# Patient Record
Sex: Female | Born: 1960 | Marital: Married
Health system: Southern US, Community
[De-identification: ages and names within clinical notes are randomized; demographics above are authoritative.]

## PROBLEM LIST (undated history)

## (undated) DIAGNOSIS — R011 Cardiac murmur, unspecified: Secondary | ICD-10-CM

## (undated) DIAGNOSIS — R61 Generalized hyperhidrosis: Secondary | ICD-10-CM

## (undated) DIAGNOSIS — K644 Residual hemorrhoidal skin tags: Secondary | ICD-10-CM

## (undated) DIAGNOSIS — B019 Varicella without complication: Secondary | ICD-10-CM

## (undated) DIAGNOSIS — I10 Essential (primary) hypertension: Secondary | ICD-10-CM

## (undated) DIAGNOSIS — E559 Vitamin D deficiency, unspecified: Secondary | ICD-10-CM

## (undated) DIAGNOSIS — N951 Menopausal and female climacteric states: Secondary | ICD-10-CM

## (undated) DIAGNOSIS — R5383 Other fatigue: Secondary | ICD-10-CM

## (undated) DIAGNOSIS — D2370 Other benign neoplasm of skin of unspecified lower limb, including hip: Secondary | ICD-10-CM

## (undated) DIAGNOSIS — E785 Hyperlipidemia, unspecified: Secondary | ICD-10-CM

## (undated) DIAGNOSIS — Z1211 Encounter for screening for malignant neoplasm of colon: Secondary | ICD-10-CM

## (undated) DIAGNOSIS — I6529 Occlusion and stenosis of unspecified carotid artery: Secondary | ICD-10-CM

## (undated) HISTORY — DX: Vitamin D deficiency, unspecified: E55.9

## (undated) HISTORY — DX: Varicella without complication: B01.9

## (undated) HISTORY — DX: Generalized hyperhidrosis: R61

## (undated) HISTORY — DX: Encounter for screening for malignant neoplasm of colon: Z12.11

## (undated) HISTORY — DX: Residual hemorrhoidal skin tags: K64.4

## (undated) HISTORY — DX: Occlusion and stenosis of unspecified carotid artery: I65.29

## (undated) HISTORY — DX: Cardiac murmur, unspecified: R01.1

## (undated) HISTORY — DX: Other benign neoplasm of skin of unspecified lower limb, including hip: D23.70

## (undated) HISTORY — DX: Hyperlipidemia, unspecified: E78.5

## (undated) HISTORY — DX: Menopausal and female climacteric states: N95.1

## (undated) HISTORY — DX: Essential (primary) hypertension: I10

## (undated) HISTORY — DX: Other fatigue: R53.83

---
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
## 2005-03-17 HISTORY — PX: GANGLION CYST EXCISION: SHX1691

## 2006-03-17 DIAGNOSIS — E7800 Pure hypercholesterolemia, unspecified: Secondary | ICD-10-CM | POA: Insufficient documentation

## 2006-03-17 DIAGNOSIS — R011 Cardiac murmur, unspecified: Secondary | ICD-10-CM

## 2006-03-17 HISTORY — DX: Cardiac murmur, unspecified: R01.1

## 2006-06-12 ENCOUNTER — Ambulatory Visit
Admit: 2006-06-12 | Disposition: A | Discharge status: Home / Self Care | Payer: Self-pay | Source: Ambulatory Visit | Admitting: Internal Medicine

## 2006-06-12 LAB — LIVER FUNCTION PANEL - AH CERNER
ALT: 18 U/L (ref 0–31)
AST (SGOT): 17 U/L (ref 0–31)
Albumin/Globulin Ratio: 1.8 (ref 1.1–2.2)
Albumin: 4.4 g/dL (ref 3.4–4.8)
Alkaline Phosphatase: 54 U/L (ref 35–104)
Bilirubin Direct: 0.2 mg/dL (ref 0.0–0.3)
Bilirubin Indirect: 0.6 mg/dL (ref 0.0–0.7)
Bilirubin, Total: 0.8 mg/dL (ref 0.0–1.0)
Globulin: 2.5 g/dL (ref 2.0–3.6)
Protein, Total: 6.9 g/dL (ref 6.4–8.3)

## 2006-06-12 LAB — CBC- CERNER
Hematocrit: 34.7 % — ABNORMAL LOW (ref 37.0–47.0)
Hgb: 12.2 G/DL (ref 12.0–16.0)
MCH: 31.7 PG (ref 28.0–32.0)
MCHC: 35.2 G/DL (ref 32.0–36.0)
MCV: 90 FL (ref 80.0–100.0)
MPV: 8.6 FL (ref 7.4–10.4)
Platelets: 195 /mm3 (ref 140–400)
RBC: 3.85 /mm3 — ABNORMAL LOW (ref 4.20–5.40)
RDW: 12.3 % (ref 11.5–15.0)
WBC: 4.3 /mm3 (ref 3.5–10.8)

## 2006-06-12 LAB — TSH: TSH: 2.61 u[IU]/mL (ref 0.400–4.610)

## 2006-06-12 LAB — LIPID STUDY FASTING AM CERNER
Cholesterol / HDL Ratio: 3.35 RATIO (ref ?–4.47)
Cholesterol: 211 mg/dL — ABNORMAL HIGH (ref 100–199)
HDL: 63 mg/dL — ABNORMAL LOW (ref >65)
LDL: 128 mg/dL (ref ?–130)
Triglycerides: 102 md/dL (ref 10–160)
VLDL: 20 mg/dL (ref 0–40)

## 2006-06-12 LAB — HEMOLYSIS INDEX: Hemolysis Index: 1 Units

## 2006-06-12 LAB — T4, FREE: T4 Free: 1.09 ng/dL (ref 0.94–1.65)

## 2006-06-12 LAB — T4: T4: 7.7 ug/dL (ref 5.6–11.0)

## 2008-04-18 ENCOUNTER — Ambulatory Visit
Admit: 2008-04-18 | Disposition: A | Discharge status: Home / Self Care | Payer: Self-pay | Source: Ambulatory Visit | Admitting: Cardiovascular Disease

## 2008-04-18 LAB — LIPID PANEL
Cholesterol: 224 mg/dL — ABNORMAL HIGH (ref ?–200)
HDL: 70 mg/dL — ABNORMAL HIGH (ref 40–65)
LDL Calculated: 134 mg/dL — ABNORMAL HIGH (ref 0–99)
Triglycerides: 98 mg/dL (ref 34–149)
VLDL Calculated: 20 mg/dL (ref 7–30)

## 2008-04-18 LAB — HEPATIC FUNCTION PANEL
ALT: 19 U/L (ref 0–55)
AST (SGOT): 16 U/L (ref 5–34)
Albumin/Globulin Ratio: 1.3 (ref 0.9–2.2)
Albumin: 3.9 g/dL (ref 3.5–5.0)
Alkaline Phosphatase: 58 U/L (ref 40–150)
Bilirubin Direct: 0.2 mg/dL (ref 0.0–0.5)
Bilirubin Indirect: 0.3 MG/DL (ref 0.0–1.1)
Bilirubin, Total: 0.5 mg/dL (ref 0.2–1.2)
Globulin: 3.1 g/dL (ref 2.0–3.6)
Protein, Total: 7 G/dL (ref 6.0–8.3)

## 2008-04-18 LAB — C-REACTIVE PROTEIN HIGH SENSITIVE: C-Reactive Protein, High Sensitive: 0.2 mg/dL (ref ?–0.50)

## 2008-04-18 LAB — HEMOLYSIS INDEX: Hemolysis Index: 2 Units

## 2008-04-18 LAB — CH/HDL: Cholesterol / HDL Ratio: 3.2 RATIO

## 2009-05-28 ENCOUNTER — Ambulatory Visit
Admit: 2009-05-28 | Disposition: A | Discharge status: Home / Self Care | Payer: Self-pay | Source: Ambulatory Visit | Admitting: Cardiovascular Disease

## 2009-05-28 LAB — COMPREHENSIVE METABOLIC PANEL
ALT: 34 U/L (ref 0–55)
AST (SGOT): 23 U/L (ref 5–34)
Albumin/Globulin Ratio: 1.4 (ref 0.9–2.2)
Albumin: 4.1 g/dL (ref 3.5–5.0)
Alkaline Phosphatase: 73 U/L (ref 40–150)
BUN: 14 mg/dL (ref 7.0–19.0)
Bilirubin, Total: 1 mg/dL (ref 0.2–1.2)
CO2: 22 mEq/L (ref 22–29)
Calcium: 9 mg/dL (ref 8.5–10.5)
Chloride: 106 mEq/L (ref 98–107)
Creatinine: 0.8 mg/dL (ref 0.6–1.0)
Globulin: 2.9 g/dL (ref 2.0–3.6)
Glucose: 95 mg/dL (ref 70–100)
Potassium: 3.9 mEq/L (ref 3.5–5.1)
Protein, Total: 7 g/dL (ref 6.0–8.3)
Sodium: 140 mEq/L (ref 136–145)

## 2009-05-28 LAB — LIPID PANEL
Cholesterol / HDL Ratio: 2.6 Index
Cholesterol: 172 mg/dL (ref 0–199)
HDL: 65 mg/dL (ref >40)
LDL Calculated: 91 mg/dL (ref 0–99)
Triglycerides: 78 mg/dL (ref 34–149)
VLDL Calculated: 16 mg/dL (ref 10–40)

## 2009-05-28 LAB — HEMOLYSIS INDEX: Hemolysis Index: 4

## 2009-05-28 LAB — GFR: EGFR: >60

## 2010-07-04 ENCOUNTER — Ambulatory Visit
Admit: 2010-07-04 | Discharge: 2010-07-04 | Disposition: A | Discharge status: Home / Self Care | Payer: Self-pay | Source: Ambulatory Visit

## 2010-07-04 ENCOUNTER — Other Ambulatory Visit: Payer: Self-pay | Admitting: Cardiovascular Disease

## 2010-07-04 LAB — LIPID PANEL
Cholesterol / HDL Ratio: 2.9 Index
Cholesterol: 145 mg/dL (ref 0–199)
HDL: 50 mg/dL (ref >40)
LDL Calculated: 74 mg/dL (ref 0–99)
Triglycerides: 104 mg/dL (ref 34–149)
VLDL Calculated: 21 mg/dL (ref 10–40)

## 2010-07-04 LAB — AST: AST (SGOT): 23 U/L (ref 5–34)

## 2010-07-04 LAB — ALT: ALT: 30 U/L (ref 0–55)

## 2010-07-04 LAB — HEMOLYSIS INDEX: Hemolysis Index: 1 Index (ref 0–9)

## 2011-09-04 ENCOUNTER — Ambulatory Visit
Admit: 2011-09-04 | Discharge: 2011-09-04 | Disposition: A | Discharge status: Home / Self Care | Payer: Self-pay | Source: Ambulatory Visit

## 2011-09-04 LAB — HEMOLYSIS INDEX: Hemolysis Index: 3 Index (ref 0–9)

## 2011-09-04 LAB — LIPID PANEL
Cholesterol / HDL Ratio: 2.7 Index
Cholesterol: 156 mg/dL (ref 0–199)
HDL: 58 mg/dL (ref >40)
LDL Calculated: 80 mg/dL (ref 0–99)
Triglycerides: 89 mg/dL (ref 34–149)
VLDL Calculated: 18 mg/dL (ref 10–40)

## 2011-09-04 LAB — AST: AST (SGOT): 15 U/L (ref 5–34)

## 2011-09-04 LAB — ALT: ALT: 18 U/L (ref 0–55)

## 2011-11-11 ENCOUNTER — Other Ambulatory Visit: Payer: Self-pay | Admitting: Cardiovascular Disease

## 2011-11-11 DIAGNOSIS — I2510 Atherosclerotic heart disease of native coronary artery without angina pectoris: Secondary | ICD-10-CM

## 2011-11-19 ENCOUNTER — Ambulatory Visit
Admission: RE | Admit: 2011-11-19 | Discharge: 2011-11-19 | Disposition: A | Discharge status: Home / Self Care | Payer: BLUE CROSS/BLUE SHIELD | Source: Ambulatory Visit | Attending: Cardiovascular Disease | Admitting: Cardiovascular Disease

## 2011-11-19 ENCOUNTER — Other Ambulatory Visit: Payer: BLUE CROSS/BLUE SHIELD

## 2011-11-19 DIAGNOSIS — R0989 Other specified symptoms and signs involving the circulatory and respiratory systems: Secondary | ICD-10-CM | POA: Insufficient documentation

## 2011-11-19 DIAGNOSIS — I2510 Atherosclerotic heart disease of native coronary artery without angina pectoris: Secondary | ICD-10-CM

## 2011-11-19 LAB — CREATININE WHOLE BLOOD: Whole Blood Creatinine: 0.65 mg/dL (ref 0.40–1.10)

## 2011-11-19 MED ORDER — GADOBUTROL 1 MMOL/ML IV SOLN
10.00 mL | Freq: Once | INTRAVENOUS | Status: AC | PRN
Start: 2011-11-19 — End: 2011-11-19
  Administered 2011-11-19: 10 mmol via INTRAVENOUS

## 2012-11-22 ENCOUNTER — Inpatient Hospital Stay
Admission: RE | Admit: 2012-11-22 | Discharge: 2012-11-22 | Disposition: A | Discharge status: Home / Self Care | Payer: BLUE CROSS/BLUE SHIELD | Source: Ambulatory Visit | Attending: Cardiovascular Disease | Admitting: Cardiovascular Disease

## 2012-11-22 DIAGNOSIS — E7800 Pure hypercholesterolemia, unspecified: Secondary | ICD-10-CM | POA: Insufficient documentation

## 2012-11-22 LAB — LIPID PANEL
Cholesterol / HDL Ratio: 2.5
Cholesterol: 157 mg/dL (ref 0–199)
HDL: 64 mg/dL (ref >40)
LDL Calculated: 81 mg/dL (ref 0–99)
Triglycerides: 59 mg/dL (ref 34–149)
VLDL Calculated: 12 mg/dL (ref 10–40)

## 2012-11-22 LAB — AST: AST (SGOT): 16 U/L (ref 5–34)

## 2012-11-22 LAB — ALT: ALT: 19 U/L (ref 0–55)

## 2012-11-22 LAB — HEMOLYSIS INDEX: Hemolysis Index: 1 (ref 0–9)

## 2015-01-24 ENCOUNTER — Ambulatory Visit (INDEPENDENT_AMBULATORY_CARE_PROVIDER_SITE_OTHER): Payer: Self-pay | Admitting: Cardiovascular Disease

## 2015-08-28 ENCOUNTER — Ambulatory Visit (INDEPENDENT_AMBULATORY_CARE_PROVIDER_SITE_OTHER): Payer: Federal, State, Local not specified - PPO | Admitting: Internal Medicine

## 2015-08-28 ENCOUNTER — Encounter: Payer: Self-pay | Admitting: Internal Medicine

## 2015-08-28 VITALS — BP 180/94 | HR 80 | Temp 98.2°F | Resp 12 | Ht 64.0 in | Wt 142.5 lb

## 2015-08-28 DIAGNOSIS — I10 Essential (primary) hypertension: Secondary | ICD-10-CM

## 2015-08-28 DIAGNOSIS — I6523 Occlusion and stenosis of bilateral carotid arteries: Secondary | ICD-10-CM

## 2015-08-28 DIAGNOSIS — R5383 Other fatigue: Secondary | ICD-10-CM | POA: Diagnosis not present

## 2015-08-28 DIAGNOSIS — L74519 Primary focal hyperhidrosis, unspecified: Secondary | ICD-10-CM

## 2015-08-28 DIAGNOSIS — D2372 Other benign neoplasm of skin of left lower limb, including hip: Secondary | ICD-10-CM

## 2015-08-28 DIAGNOSIS — N951 Menopausal and female climacteric states: Secondary | ICD-10-CM

## 2015-08-28 DIAGNOSIS — Z800 Family history of malignant neoplasm of digestive organs: Secondary | ICD-10-CM

## 2015-08-28 DIAGNOSIS — E785 Hyperlipidemia, unspecified: Secondary | ICD-10-CM | POA: Diagnosis not present

## 2015-08-28 DIAGNOSIS — E559 Vitamin D deficiency, unspecified: Secondary | ICD-10-CM | POA: Diagnosis not present

## 2015-08-28 DIAGNOSIS — Z1239 Encounter for other screening for malignant neoplasm of breast: Secondary | ICD-10-CM

## 2015-08-28 DIAGNOSIS — R61 Generalized hyperhidrosis: Secondary | ICD-10-CM

## 2015-08-28 MED ORDER — NORETHINDRONE-ETH ESTRADIOL 0.5-2.5 MG-MCG PO TABS: 1.0000 | ORAL_TABLET | Freq: Every day | ORAL | Status: DC

## 2015-08-28 NOTE — Progress Notes (Signed)
Pre-visit discussion using our clinic review tool. No additional management support is needed unless otherwise documented below in the visit note.  

## 2015-08-28 NOTE — Patient Instructions (Signed)
Great meeting you!  Mammogram,  Dermatology and GI referral are in process

## 2015-08-28 NOTE — Progress Notes (Signed)
Subjective:  Patient ID: Evelyn Graham, female    DOB: 1961-01-16  Age: 55 y.o. MRN: [id number]  CC: The primary encounter diagnosis was Vitamin D deficiency. 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  HPI [name] presents for new patient /establishment of care, referred by her sister, [name].  She has recently returned to the town of her youth, having relocated from [place], [place]  With her husband.     History of hypertension complicated by white coat syndrome.  Home readings have not been checked lately due to the recent move.  Denies chest pain,  Leg swelling and shortness of breath, Has had hypertension for 15 years or more. No other meds due to concurrent history of palpitations, which were attributed to SVT and caffeine.  Has been on same medication for > 15 years.  Hyperlipidemia.  Tolerating Lipitor. DUE for liver enzyme testing  . had carotid artery dopplers with periodic surveillance for noncritical stenosis. FH of early CAD in father.     Hyperhidrosis managed with botox twice annually,  Prior trials of iontopheresis and 20% aluminium chloride  failed.  FH of same.   Requesting dermatology referral to [facility] for suspicious lesion  on right breast and left calf  Wants to consolidate care.  In the past was seeing both gyn and cardiology annually along with family physician.   Last PAP 2015 had a normal colposcopy 8-10 years  Last mammogram due in 2016 at [facility] in [place] Sept  No FH or BRCA but mother is 61 and diagnosed with  Paget's Disease of breast at 65. No prior colon CA screening .  Maternal uncle had colon CA NEEDS REFERRAL.  Menopause: Last period 2 years ago,  On  HRT and in need of efill .  Started HRT due to syndrome which was miserable and included hot flashes that were occurring every 12 minutes , and excessive anxiety ("I felt like another person was living insdie me)     History Evelyn Graham has a past medical history of Chicken pox (1969); Heart murmur (2008); Hyperlipidemia; and Hyperhydrosis disorder.   She has past surgical history that includes Ganglion cyst excision (Left, 2007).   Her family history includes Cancer in her maternal uncle, paternal grandfather, and paternal grandmother; Heart disease in her maternal grandfather and maternal grandmother; Heart disease (age of onset: 7) in her father; Hyperlipidemia in her father and mother; Hypertension in her father; Paget's disease of bone in her mother.She reports that she has never smoked. She has never used smokeless tobacco. She reports that she drinks alcohol. She reports that she does not use illicit drugs.  No outpatient prescriptions prior to visit.   No facility-administered medications prior to visit.    Review of Systems:  Patient denies headache, fevers, malaise, unintentional weight loss, skin rash, eye pain, sinus congestion and sinus pain, sore throat, dysphagia,  hemoptysis , cough, dyspnea, wheezing, chest pain, palpitations, orthopnea, edema, abdominal pain, nausea, melena, diarrhea, constipation, flank pain, dysuria, hematuria, urinary  Frequency, nocturia, numbness, tingling, seizures,  Focal weakness, Loss of consciousness,  Tremor, insomnia, depression, anxiety, and suicidal ideation.     Objective:  BP 180/94 mmHg  Pulse 80  Temp(Src) 98.2 F (36.8 C) (Oral)  Resp 12  Ht _0  (1.626 m)  Wt  142 lb 8 oz (64.638 kg)  BMI 24.45 kg/m2  SpO2 99%  Physical Exam:  General appearance: alert, cooperative and appears stated age Ears: normal TM's and external ear canals both ears Throat: lips, mucosa, and tongue normal; teeth and gums normal Neck: no adenopathy, no carotid  bruit, supple, symmetrical, trachea midline and thyroid not enlarged, symmetric, no tenderness/mass/nodules Back: symmetric, no curvature. ROM normal. No CVA tenderness. Lungs: clear to auscultation bilaterally Heart: regular rate and rhythm, S1, S2 normal, no murmur, click, rub or gallop Abdomen: soft, non-tender; bowel sounds normal; no masses,  no organomegaly Pulses: 2+ and symmetric Skin:  Palms pale and sweaty, skin moist under arms and on torso, behind knees. , turgor normal. Purplish paular scaling lesion on right breast and left calf.  Lymph nodes: Cervical, supraclavicular, and axillary nodes normal.   Assessment & Plan:   Problem List Items Addressed This Visit    Vitamin D deficiency - Primary   Relevant Orders   VITAMIN D 25 Hydroxy (Vit-D Deficiency, Fractures) (Completed)   Hyperlipidemia    Managed with lipitor,  Goal LDL < 100 given FH of early CAD and carotid stenosis . She will return for fasting labs in 3 months . 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 Referral to Dr Phillip Heal,       Relevant Orders   Ambulatory referral to Dermatology   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    Her left calf and right breast have  purple scaling lesions that is suspicious for squamous cell CA. Refer to Dr Phillip Heal in process      Relevant Orders   Ambulatory referral to Dermatology   Essential hypertension    shehas  prior history of hypertension. she will check his blood  pressure several times over the next 3-4 weeks and to submit readings for evaluation. Lab Results  Component Value Date   CREATININE 0.79 08/28/2015   Lab Results  Component Value Date   NA 140 08/28/2015   K 3.7 08/28/2015   CL 104 08/28/2015   CO2 25 08/28/2015          Relevant Medications   atorvastatin (LIPITOR) 20 MG tablet   metoprolol succinate (TOPROL-XL) 50 MG 24 hr tablet   aspirin 81 MG tablet   Carotid stenosis    Non critical  Every other year surveillance by former cardiologist,  Records requested,  continue Lipitor and asa daily       Relevant Medications   atorvastatin (LIPITOR) 20 MG tablet   metoprolol succinate (TOPROL-XL) 50 MG 24 hr tablet   aspirin 81 MG tablet   Menopause syndrome    She understands that longterm use of HRT increases her risk of clots, stokes and MIs but was so miserable without it the benefits outweigh the risks.        Other Visit Diagnoses    Breast cancer screening  Relevant Orders    MM DIGITAL SCREENING BILATERAL    Family history of colon cancer requiring screening colonoscopy        Relevant Orders    Ambulatory referral to General Surgery      A total of 60 minutes was spent with patient more than half of which was spent in counseling patient on the above mentioned issues , reviewing and explaining recent labs and imaging studies done, and coordination of care.  I am having Ms. Packard maintain her atorvastatin, metoprolol succinate, aspirin, Glucosamine HCl, OnabotulinumtoxinA (BOTOX IJ), and norethindrone-ethinyl estradiol.  Meds ordered this encounter  Medications  . atorvastatin (LIPITOR) 20 MG tablet    Sig: Take 20 mg by mouth at bedtime.    Refill:  2  . metoprolol succinate (TOPROL-XL) 50 MG 24 hr tablet    Sig: Take 50 mg by mouth daily.    Refill:  2  . DISCONTD: norethindrone-ethinyl estradiol (FEMHRT LOW DOSE) 0.5-2.5 MG-MCG tablet    Sig: Take 1 tablet by mouth daily.    Refill:  0  . aspirin  81 MG tablet    Sig: Take 81 mg by mouth daily.  . Glucosamine HCl 1000 MG TABS    Sig: Take 4,000 mg by mouth daily.  . OnabotulinumtoxinA (BOTOX IJ)    Sig: Inject 2 vials as directed every 6 (six) months.  . norethindrone-ethinyl estradiol (FEMHRT LOW DOSE) 0.5-2.5 MG-MCG tablet    Sig: Take 1 tablet by mouth daily.    Dispense:  90 tablet    Refill:  2    Medications Discontinued During This Encounter  Medication Reason  . norethindrone-ethinyl estradiol (FEMHRT LOW DOSE) 0.5-2.5 MG-MCG tablet Reorder    Follow-up: Return in about 6 months (around 02/27/2016) for RN visit for BP check bring meter .   Crecencio Mc, MD

## 2015-08-29 LAB — COMPREHENSIVE METABOLIC PANEL
ALK PHOS: 88 U/L (ref 39–117)
ALT: 26 U/L (ref 0–35)
AST: 19 U/L (ref 0–37)
Albumin: 5.1 g/dL (ref 3.5–5.2)
BILIRUBIN TOTAL: 1.2 mg/dL (ref 0.2–1.2)
BUN: 8 mg/dL (ref 6–23)
CALCIUM: 10 mg/dL (ref 8.4–10.5)
CO2: 25 mEq/L (ref 19–32)
Chloride: 104 mEq/L (ref 96–112)
Creatinine, Ser: 0.79 mg/dL (ref 0.40–1.20)
GFR: 80.98 mL/min (ref >60.00)
Glucose, Bld: 104 mg/dL — ABNORMAL HIGH (ref 70–99)
POTASSIUM: 3.7 meq/L (ref 3.5–5.1)
Sodium: 140 mEq/L (ref 135–145)
TOTAL PROTEIN: 7.7 g/dL (ref 6.0–8.3)

## 2015-08-29 LAB — VITAMIN D 25 HYDROXY (VIT D DEFICIENCY, FRACTURES): VITD: 49.81 ng/mL (ref 30.00–100.00)

## 2015-08-29 LAB — TSH: TSH: 1.48 u[IU]/mL (ref 0.35–4.50)

## 2015-08-29 LAB — LDL CHOLESTEROL, DIRECT: LDL DIRECT: 81 mg/dL

## 2015-08-30 DIAGNOSIS — I6529 Occlusion and stenosis of unspecified carotid artery: Secondary | ICD-10-CM | POA: Insufficient documentation

## 2015-08-30 DIAGNOSIS — I10 Essential (primary) hypertension: Secondary | ICD-10-CM | POA: Insufficient documentation

## 2015-08-30 DIAGNOSIS — E559 Vitamin D deficiency, unspecified: Secondary | ICD-10-CM | POA: Insufficient documentation

## 2015-08-30 DIAGNOSIS — N951 Menopausal and female climacteric states: Secondary | ICD-10-CM | POA: Insufficient documentation

## 2015-08-30 DIAGNOSIS — R61 Generalized hyperhidrosis: Secondary | ICD-10-CM

## 2015-08-30 DIAGNOSIS — E785 Hyperlipidemia, unspecified: Secondary | ICD-10-CM | POA: Insufficient documentation

## 2015-08-30 DIAGNOSIS — R5383 Other fatigue: Secondary | ICD-10-CM | POA: Insufficient documentation

## 2015-08-30 DIAGNOSIS — D2370 Other benign neoplasm of skin of unspecified lower limb, including hip: Secondary | ICD-10-CM | POA: Insufficient documentation

## 2015-08-30 HISTORY — DX: Occlusion and stenosis of unspecified carotid artery: I65.29

## 2015-08-30 HISTORY — DX: Essential (primary) hypertension: I10

## 2015-08-30 HISTORY — DX: Generalized hyperhidrosis: R61

## 2015-08-30 HISTORY — DX: Vitamin D deficiency, unspecified: E55.9

## 2015-08-30 HISTORY — DX: Other fatigue: R53.83

## 2015-08-30 HISTORY — DX: Other benign neoplasm of skin of unspecified lower limb, including hip: D23.70

## 2015-08-30 HISTORY — DX: Menopausal and female climacteric states: N95.1

## 2015-08-30 NOTE — Assessment & Plan Note (Signed)
shehas  prior history of hypertension. she will check his blood pressure several times over the next 3-4 weeks and to submit readings for evaluation. Lab Results  Component Value Date   CREATININE 0.79 08/28/2015   Lab Results  Component Value Date   NA 140 08/28/2015   K 3.7 08/28/2015   CL 104 08/28/2015   CO2 25 08/28/2015

## 2015-08-30 NOTE — Assessment & Plan Note (Signed)
Managed with lipitor,  Goal LDL < 100 given FH of early CAD and carotid stenosis . She will return for fasting labs in 3 months . Liver enzymes normal   Lab Results  Component Value Date   ALT 26 08/28/2015   AST 19 08/28/2015   ALKPHOS 88 08/28/2015   BILITOT 1.2 08/28/2015

## 2015-08-30 NOTE — Assessment & Plan Note (Signed)
Non critical  Every other year surveillance by former cardiologist,  Records requested,  continue Lipitor and asa daily

## 2015-08-30 NOTE — Assessment & Plan Note (Addendum)
Her left calf and right breast have  purple scaling lesions that is suspicious for squamous cell CA. Refer to Dr Phillip Heal in process

## 2015-08-30 NOTE — Assessment & Plan Note (Signed)
She understands that longterm use of HRT increases her risk of clots, stokes and MIs but was so miserable without it the benefits outweigh the risks.

## 2015-08-30 NOTE — Assessment & Plan Note (Signed)
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 Referral to Dr Phillip Heal,

## 2015-08-31 ENCOUNTER — Encounter: Payer: Self-pay | Admitting: General Surgery

## 2015-08-31 ENCOUNTER — Encounter: Payer: Self-pay | Admitting: *Deleted

## 2015-09-13 ENCOUNTER — Encounter: Payer: Self-pay | Admitting: Internal Medicine

## 2015-09-19 ENCOUNTER — Encounter: Payer: Self-pay | Admitting: *Deleted

## 2015-09-25 ENCOUNTER — Ambulatory Visit (INDEPENDENT_AMBULATORY_CARE_PROVIDER_SITE_OTHER): Payer: Federal, State, Local not specified - PPO | Admitting: General Surgery

## 2015-09-25 ENCOUNTER — Encounter: Payer: Self-pay | Admitting: General Surgery

## 2015-09-25 VITALS — BP 146/80 | HR 70 | Resp 12 | Ht 64.0 in | Wt 142.0 lb

## 2015-09-25 DIAGNOSIS — Z1211 Encounter for screening for malignant neoplasm of colon: Secondary | ICD-10-CM

## 2015-09-25 HISTORY — DX: Encounter for screening for malignant neoplasm of colon: Z12.11

## 2015-09-25 MED ORDER — POLYETHYLENE GLYCOL 3350 17 GM/SCOOP PO POWD: 1.0000 | Freq: Once | ORAL | Status: DC

## 2015-09-25 NOTE — Progress Notes (Signed)
Patient ID: Evelyn Graham, female   DOB: Apr 14, 1960, 55 y.o.   MRN: [id number]  Chief Complaint  Patient presents with  . Colonoscopy    HPI Evelyn Graham is a 55 y.o. female.  Who presents for a colonoscopy discussion, none prior. Denies any gastrointestinal issues. Bowels move regular every 2- 3 days and no bleeding noted. She does admit to external hemorrhoid that bleeds on occasion. She has recently moved here from [place] area, originally form [place] area. Formerly worked for the [employer]. She cut her left thumb last night while opening a pickle jar..  I personally reviewed the patient's history.  HPI  Past Medical History  Diagnosis Date  . Chicken pox 1969  . Heart murmur 2008  . Hyperlipidemia   . Hyperhydrosis disorder   . External hemorrhoid     Past Surgical History  Procedure Laterality Date  . Ganglion cyst excision Left 2007    Family History  Problem Relation Age of Onset  . Hyperlipidemia Mother   . Paget's disease of bone Mother   . Heart disease Father 4    early forties  . Hyperlipidemia Father   . Hypertension Father   . Cancer Maternal Uncle 60    colon  . Heart disease Maternal Grandmother   . Heart disease Maternal Grandfather   . Cancer Paternal Grandmother     stomach cancer?  . Cancer Paternal Grandfather     stomach cancer?    Social History Social History  Substance Use Topics  . Smoking status: Never Smoker   . Smokeless tobacco: Never Used  . Alcohol Use: 0.0 oz/week    0 Standard drinks or equivalent per week     Comment: 3 to 4 glasses of wine per week    Allergies  Allergen Reactions  . Sulfa Antibiotics Anaphylaxis    Current Outpatient Prescriptions  Medication Sig Dispense Refill  . aspirin 81 MG tablet Take 81 mg by mouth daily.    Marland Kitchen atorvastatin (LIPITOR) 20 MG tablet Take 20 mg by mouth at bedtime.  2  . Glucosamine HCl 1000 MG TABS Take 4,000 mg by mouth daily.    . metoprolol succinate  (TOPROL-XL) 50 MG 24 hr tablet Take 50 mg by mouth daily.  2  . norethindrone-ethinyl estradiol (FEMHRT LOW DOSE) 0.5-2.5 MG-MCG tablet Take 1 tablet by mouth daily. 90 tablet 2  . OnabotulinumtoxinA (BOTOX IJ) Inject 2 vials as directed every 6 (six) months.    . polyethylene glycol powder (GLYCOLAX/MIRALAX) powder Take 255 g by mouth once. 255 g 0   No current facility-administered medications for this visit.    Review of Systems Review of Systems  Constitutional: Negative.   Respiratory: Negative.   Cardiovascular: Negative.   Gastrointestinal: Negative for diarrhea, constipation and anal bleeding.    Blood pressure 146/80, pulse 70, resp. rate 12, height 5\' 4"  (1.626 m), weight 142 lb (64.411 kg).  Physical Exam Physical Exam  Constitutional: She is oriented to person, place, and time. She appears well-developed and well-nourished.  HENT:  Mouth/Throat: Oropharynx is clear and moist.  Eyes: Conjunctivae are normal. No scleral icterus.  Neck: Neck supple.  Cardiovascular: Normal rate, regular rhythm and normal heart sounds.   Pulmonary/Chest: Effort normal and breath sounds normal.  Musculoskeletal:       Arms: Lymphadenopathy:    She has no cervical adenopathy.  Neurological: She is alert and oriented to person, place, and time.  Skin: Skin is warm and dry.  Psychiatric: Her behavior is normal.    Data Reviewed PCP notes.  Assessment    Candidate for screening colonoscopy.  Superficial laceration of left hand.       Plan    The laceration was treated with benzoin and Steri-Strip. No indication for sutures out 12+ hours post injury.    Colonoscopy with possible biopsy/polypectomy prn: Information regarding the procedure, including its potential risks and complications (including but not limited to perforation of the bowel, which may require emergency surgery to repair, and bleeding) was verbally given to the patient. Educational information regarding lower  intestinal endoscopy was given to the patient. Written instructions for how to complete the bowel prep using Miralax were provided. The importance of drinking ample fluids to avoid dehydration as a result of the prep emphasized.  The patient is scheduled for a Colonoscopy at [facility] on 11/21/15. They are aware to call the day before to get their arrival time. Miralax prescription has been sent into the patient's pharmacy. The patient is aware of date and instructions.    PCP:  Deborra Medina  This information has been scribed by Karie Fetch RN, BSN,BC.   [name] 09/25/2015, 9:04 PM

## 2015-09-25 NOTE — Patient Instructions (Addendum)
Colonoscopy A colonoscopy is an exam to look at the entire large intestine (colon). This exam can help find problems such as tumors, polyps, inflammation, and areas of bleeding. The exam takes about 1 hour.  LET [facility] CARE PROVIDER KNOW ABOUT:   Any allergies you have.  All medicines you are taking, including vitamins, herbs, eye drops, creams, and over-the-counter medicines.  Previous problems you or members of your family have had with the use of anesthetics.  Any blood disorders you have.  Previous surgeries you have had.  Medical conditions you have. RISKS AND COMPLICATIONS  Generally, this is a safe procedure. However, as with any procedure, complications can occur. Possible complications include:  Bleeding.  Tearing or rupture of the colon wall.  Reaction to medicines given during the exam.  Infection (rare). BEFORE THE PROCEDURE   Ask your health care provider about changing or stopping your regular medicines.  You may be prescribed an oral bowel prep. This involves drinking a large amount of medicated liquid, starting the day before your procedure. The liquid will cause you to have multiple loose stools until your stool is almost clear or light green. This cleans out your colon in preparation for the procedure.  Do not eat or drink anything else once you have started the bowel prep, unless your health care provider tells you it is safe to do so.  Arrange for someone to drive you home after the procedure. PROCEDURE   You will be given medicine to help you relax (sedative).  You will lie on your side with your knees bent.  A long, flexible tube with a light and camera on the end (colonoscope) will be inserted through the rectum and into the colon. The camera sends video back to a computer screen as it moves through the colon. The colonoscope also releases carbon dioxide gas to inflate the colon. This helps your health care provider see the area better.  During  the exam, your health care provider may take a small tissue sample (biopsy) to be examined under a microscope if any abnormalities are found.  The exam is finished when the entire colon has been viewed. AFTER THE PROCEDURE   Do not drive for 24 hours after the exam.  You may have a small amount of blood in your stool.  You may pass moderate amounts of gas and have mild abdominal cramping or bloating. This is caused by the gas used to inflate your colon during the exam.  Ask when your test results will be ready and how you will get your results. Make sure you get your test results.   This information is not intended to replace advice given to you by your health care provider. Make sure you discuss any questions you have with your health care provider.   Document Released: 02/29/2000 Document Revised: 12/22/2012 Document Reviewed: 11/08/2012 Elsevier Interactive Patient Education [employer].  The patient is scheduled for a Colonoscopy at [facility] on 11/21/15. They are aware to call the day before to get their arrival time. Miralax prescription has been sent into the patient's pharmacy. The patient is aware of date and instructions.

## 2015-11-21 ENCOUNTER — Encounter: Payer: Self-pay | Admitting: *Deleted

## 2015-11-21 ENCOUNTER — Encounter
Admission: RE | Disposition: A | Discharge status: Home / Self Care | Payer: Self-pay | Source: Ambulatory Visit | Attending: General Surgery

## 2015-11-21 ENCOUNTER — Ambulatory Visit
Admission: RE | Admit: 2015-11-21 | Discharge: 2015-11-21 | Disposition: A | Discharge status: Home / Self Care | Payer: Federal, State, Local not specified - PPO | Source: Ambulatory Visit | Attending: General Surgery | Admitting: General Surgery

## 2015-11-21 ENCOUNTER — Ambulatory Visit: Payer: Federal, State, Local not specified - PPO | Admitting: Certified Registered Nurse Anesthetist

## 2015-11-21 DIAGNOSIS — Z882 Allergy status to sulfonamides status: Secondary | ICD-10-CM | POA: Insufficient documentation

## 2015-11-21 DIAGNOSIS — R011 Cardiac murmur, unspecified: Secondary | ICD-10-CM | POA: Diagnosis not present

## 2015-11-21 DIAGNOSIS — Z9889 Other specified postprocedural states: Secondary | ICD-10-CM | POA: Insufficient documentation

## 2015-11-21 DIAGNOSIS — R61 Generalized hyperhidrosis: Secondary | ICD-10-CM | POA: Insufficient documentation

## 2015-11-21 DIAGNOSIS — E785 Hyperlipidemia, unspecified: Secondary | ICD-10-CM | POA: Insufficient documentation

## 2015-11-21 DIAGNOSIS — Z87892 Personal history of anaphylaxis: Secondary | ICD-10-CM | POA: Diagnosis not present

## 2015-11-21 DIAGNOSIS — K644 Residual hemorrhoidal skin tags: Secondary | ICD-10-CM | POA: Insufficient documentation

## 2015-11-21 DIAGNOSIS — Z793 Long term (current) use of hormonal contraceptives: Secondary | ICD-10-CM | POA: Diagnosis not present

## 2015-11-21 DIAGNOSIS — Z7982 Long term (current) use of aspirin: Secondary | ICD-10-CM | POA: Insufficient documentation

## 2015-11-21 DIAGNOSIS — Z1211 Encounter for screening for malignant neoplasm of colon: Secondary | ICD-10-CM | POA: Insufficient documentation

## 2015-11-21 DIAGNOSIS — Z8619 Personal history of other infectious and parasitic diseases: Secondary | ICD-10-CM | POA: Insufficient documentation

## 2015-11-21 DIAGNOSIS — Z79899 Other long term (current) drug therapy: Secondary | ICD-10-CM | POA: Insufficient documentation

## 2015-11-21 HISTORY — PX: COLONOSCOPY WITH PROPOFOL: SHX5780

## 2015-11-21 LAB — POCT PREGNANCY, URINE: Preg Test, Ur: NEGATIVE

## 2015-11-21 SURGERY — COLONOSCOPY WITH PROPOFOL
Anesthesia: General

## 2015-11-21 MED ORDER — PROPOFOL 10 MG/ML IV BOLUS: INTRAVENOUS | Status: DC | PRN

## 2015-11-21 MED ORDER — SODIUM CHLORIDE 0.9 % IV SOLN
INTRAVENOUS | Status: DC
  Administered 2015-11-21: 11:00:00 via INTRAVENOUS
  Administered 2015-11-21: 1000 mL via INTRAVENOUS

## 2015-11-21 MED ORDER — LIDOCAINE HCL (CARDIAC) 20 MG/ML IV SOLN
INTRAVENOUS | Status: DC | PRN
Start: 2015-11-21 — End: 2015-11-21

## 2015-11-21 MED ORDER — GLYCOPYRROLATE 0.2 MG/ML IJ SOLN: INTRAMUSCULAR | Status: DC | PRN

## 2015-11-21 MED ORDER — EPHEDRINE SULFATE 50 MG/ML IJ SOLN
INTRAMUSCULAR | Status: DC | PRN
  Administered 2015-11-21: 10 mg via INTRAVENOUS

## 2015-11-21 MED ORDER — PROPOFOL 10 MG/ML IV BOLUS
INTRAVENOUS | Status: DC | PRN
  Administered 2015-11-21: 50 mg via INTRAVENOUS

## 2015-11-21 MED ORDER — PROPOFOL 500 MG/50ML IV EMUL: INTRAVENOUS | Status: DC | PRN

## 2015-11-21 MED ORDER — LIDOCAINE HCL (CARDIAC) 20 MG/ML IV SOLN
INTRAVENOUS | Status: DC | PRN
  Administered 2015-11-21: 60 mg via INTRAVENOUS

## 2015-11-21 MED ORDER — PROPOFOL 500 MG/50ML IV EMUL
INTRAVENOUS | Status: DC | PRN
  Administered 2015-11-21: 150 ug/kg/min via INTRAVENOUS

## 2015-11-21 NOTE — Op Note (Signed)
[facility] Patient Name: Evelyn Graham Procedure Date: 11/21/2015 10:19 AM MRN: [id number] Account #: [id number] Date of Birth: 1960-04-12 Admit Type: Outpatient Age: 55 Room: [facility] ENDO ROOM 1 Gender: Female Note Status: Finalized Procedure:            Colonoscopy Indications:          Screening for colorectal malignant neoplasm Providers:            [name], MD Referring MD:         Deborra Medina, MD (Referring MD) Medicines:            Monitored Anesthesia Care Complications:        No immediate complications. Procedure:            Pre-Anesthesia Assessment:                       - Prior to the procedure, a History and Physical was                        performed, and patient medications, allergies and                        sensitivities were reviewed. The patient's tolerance of                        previous anesthesia was reviewed.                       - The risks and benefits of the procedure and the                        sedation options and risks were discussed with the                        patient. All questions were answered and informed                        consent was obtained.                       After obtaining informed consent, the colonoscope was                        passed under direct vision. Throughout the procedure,                        the patient's blood pressure, pulse, and oxygen                        saturations were monitored continuously. The                        Colonoscope was introduced through the anus and                        advanced to the the cecum, identified by appendiceal                        orifice and ileocecal valve. The colonoscopy was  somewhat difficult due to significant looping.                        Successful completion of the procedure was aided by                        applying abdominal pressure. The patient tolerated the     procedure well. The quality of the bowel preparation                        was excellent. Findings:      The entire examined colon appeared normal on direct and retroflexion       views. Impression:           - The entire examined colon is normal on direct and                        retroflexion views.                       - No specimens collected. Recommendation:       - Repeat colonoscopy in 10 years for screening purposes. Procedure Code(s):    --- Professional ---                       [phone], Colonoscopy, flexible; diagnostic, including                        collection of specimen(s) by brushing or washing, when                        performed (separate procedure) Diagnosis Code(s):    --- Professional ---                       Z12.11, Encounter for screening for malignant neoplasm                        of colon CPT copyright 2016 American Medical Association. All rights reserved. The codes documented in this report are preliminary and upon coder review may  be revised to meet current compliance requirements. [name], MD 11/21/2015 11:04:46 AM This report has been signed electronically. Number of Addenda: 0 Note Initiated On: 11/21/2015 10:19 AM Scope Withdrawal Time: 0 hours 7 minutes 41 seconds  Total Procedure Duration: 0 hours 16 minutes 40 seconds       [facility]

## 2015-11-21 NOTE — H&P (Signed)
Evelyn Graham [id number] 02-Nov-1960     HPI: Candidate for screening colonoscopy. Tolerated prep well.   Prescriptions Prior to Admission  Medication Sig Dispense Refill Last Dose  . metoprolol succinate (TOPROL-XL) 50 MG 24 hr tablet Take 50 mg by mouth daily.  2 11/20/2015 at 2230  . polyethylene glycol powder (GLYCOLAX/MIRALAX) powder Take 255 g by mouth once. 255 g 0 11/20/2015 at 1300  . aspirin 81 MG tablet Take 81 mg by mouth daily.   Taking  . atorvastatin (LIPITOR) 20 MG tablet Take 20 mg by mouth at bedtime.  2 Taking  . Glucosamine HCl 1000 MG TABS Take 4,000 mg by mouth daily.   Taking  . norethindrone-ethinyl estradiol (FEMHRT LOW DOSE) 0.5-2.5 MG-MCG tablet Take 1 tablet by mouth daily. 90 tablet 2 Taking  . OnabotulinumtoxinA (BOTOX IJ) Inject 2 vials as directed every 6 (six) months.   Taking   Allergies  Allergen Reactions  . Sulfa Antibiotics Anaphylaxis   Past Medical History:  Diagnosis Date  . Chicken pox 1969  . External hemorrhoid   . Heart murmur 2008  . Hyperhydrosis disorder   . Hyperlipidemia    Past Surgical History:  Procedure Laterality Date  . GANGLION CYST EXCISION Left 2007   Social History   Social History  . Marital status: Married    Spouse name: N/A  . Number of children: N/A  . Years of education: N/A   Occupational History  . Not on file.   Social History Main Topics  . Smoking status: Never Smoker  . Smokeless tobacco: Never Used  . Alcohol use 0.0 oz/week     Comment: 3 to 4 glasses of wine per week  . Drug use: No  . Sexual activity: Yes   Other Topics Concern  . Not on file   Social History Narrative  . No narrative on file   Social History   Social History Narrative  . No narrative on file     ROS: Negative.     PE: HEENT: Negative. Lungs: Clear. Cardio: RR.  Assessment/Plan:  Proceed with planned endoscopy.    [name] 11/21/2015

## 2015-11-21 NOTE — Transfer of Care (Signed)
Immediate Anesthesia Transfer of Care Note  Patient: Evelyn Graham  Procedure(s) Performed: Procedure(s): COLONOSCOPY WITH PROPOFOL (N/A)  Patient Location: PACU and Endoscopy Unit  Anesthesia Type:General  Level of Consciousness: awake  Airway & Oxygen Therapy: Patient connected to nasal cannula oxygen  Post-op Assessment: Report given to RN  Post vital signs: stable  Last Vitals:  Vitals:   11/21/15 0926 11/21/15 1110  BP: (!) 137/57   Pulse: (!) 55   Resp: 17   Temp: 36.9 C 36.4 C    Last Pain:  Vitals:   11/21/15 1110  TempSrc: Tympanic         Complications: No apparent anesthesia complications

## 2015-11-21 NOTE — Anesthesia Postprocedure Evaluation (Signed)
Anesthesia Post Note  Patient: Archivist  Procedure(s) Performed: Procedure(s) (LRB): COLONOSCOPY WITH PROPOFOL (N/A)  Patient location during evaluation: Endoscopy Anesthesia Type: General Level of consciousness: awake and alert Pain management: pain level controlled Vital Signs Assessment: post-procedure vital signs reviewed and stable Respiratory status: spontaneous breathing, nonlabored ventilation, respiratory function stable and patient connected to nasal cannula oxygen Cardiovascular status: blood pressure returned to baseline and stable Postop Assessment: no signs of nausea or vomiting Anesthetic complications: no    Last Vitals:  Vitals:   11/21/15 1110 11/21/15 1130  BP: (!) 107/59 119/63  Pulse: 79   Resp: 16   Temp: 36.4 C     Last Pain:  Vitals:   11/21/15 1110  TempSrc: Tympanic                 Martha Clan

## 2015-11-21 NOTE — Anesthesia Preprocedure Evaluation (Signed)
Anesthesia Evaluation  Patient identified by MRN, date of birth, ID band Patient awake    Reviewed: Allergy & Precautions, H&P , NPO status , Patient's Chart, lab work & pertinent test results, reviewed documented beta blocker date and time   History of Anesthesia Complications (+) PONV and history of anesthetic complications  Airway Mallampati: I  TM Distance: >3 FB Neck ROM: full    Dental no notable dental hx. (+) Caps   Pulmonary neg pulmonary ROS,    Pulmonary exam normal breath sounds clear to auscultation       Cardiovascular Exercise Tolerance: Good hypertension, On Home Beta Blockers (-) angina+ Peripheral Vascular Disease  (-) CAD, (-) Past MI, (-) Cardiac Stents and (-) CABG Normal cardiovascular exam+ dysrhythmias (Palpitations) + Valvular Problems/Murmurs MVP  Rhythm:regular Rate:Normal     Neuro/Psych negative neurological ROS  negative psych ROS   GI/Hepatic negative GI ROS, Neg liver ROS,   Endo/Other  negative endocrine ROS  Renal/GU negative Renal ROS  negative genitourinary   Musculoskeletal   Abdominal   Peds  Hematology negative hematology ROS (+)   Anesthesia Other Findings Past Medical History: 1969: Chicken pox No date: External hemorrhoid 2008: Heart murmur 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   Dental Advisory Given  Plan Discussed with: Anesthesiologist, CRNA and Surgeon  Anesthesia Plan Comments:         Anesthesia Quick Evaluation

## 2015-11-21 NOTE — Transfer of Care (Signed)
Immediate Anesthesia Transfer of Care Note  Patient: Evelyn Graham  Procedure(s) Performed: Procedure(s): COLONOSCOPY WITH PROPOFOL (N/A)  Patient Location: PACU and Endoscopy Unit  Anesthesia Type:General  Level of Consciousness: awake  Airway & Oxygen Therapy: Patient Spontanous Breathing  Post-op Assessment: Report given to RN  Post vital signs: stable  Last Vitals:  Vitals:   11/21/15 0926  BP: (!) 137/57  Pulse: (!) 55  Resp: 17  Temp: 36.9 C    Last Pain:  Vitals:   11/21/15 0926  TempSrc: Oral         Complications: No apparent anesthesia complications

## 2015-11-23 ENCOUNTER — Encounter: Payer: Self-pay | Admitting: General Surgery

## 2016-01-28 ENCOUNTER — Ambulatory Visit (INDEPENDENT_AMBULATORY_CARE_PROVIDER_SITE_OTHER): Payer: Federal, State, Local not specified - PPO | Admitting: Internal Medicine

## 2016-01-28 ENCOUNTER — Encounter: Payer: Self-pay | Admitting: Internal Medicine

## 2016-01-28 VITALS — BP 128/78 | HR 57 | Temp 98.3°F | Resp 12 | Ht 64.0 in | Wt 142.2 lb

## 2016-01-28 DIAGNOSIS — E7800 Pure hypercholesterolemia, unspecified: Secondary | ICD-10-CM

## 2016-01-28 DIAGNOSIS — I10 Essential (primary) hypertension: Secondary | ICD-10-CM

## 2016-01-28 DIAGNOSIS — R5383 Other fatigue: Secondary | ICD-10-CM

## 2016-01-28 DIAGNOSIS — E785 Hyperlipidemia, unspecified: Secondary | ICD-10-CM | POA: Diagnosis not present

## 2016-01-28 MED ORDER — ATORVASTATIN CALCIUM 20 MG PO TABS: 20.0000 mg | ORAL_TABLET | Freq: Every day | ORAL | 1 refills | Status: DC

## 2016-01-28 MED ORDER — METOPROLOL SUCCINATE ER 50 MG PO TB24: 50.0000 mg | ORAL_TABLET | Freq: Every day | ORAL | 1 refills | Status: DC

## 2016-01-28 NOTE — Progress Notes (Signed)
Pre-visit discussion using our clinic review tool. No additional management support is needed unless otherwise documented below in the visit note.  

## 2016-01-28 NOTE — Patient Instructions (Addendum)
Ask insurance about the Shingrix vaccine ( new shingles vaccine for anyone > 50)   I recorded your TDaP vaccine  We will screen for your Hep C AND HIV with your fasting labs   RTC 6 months for annual exam with PAP   DON'T FORGET YOUR MAMMOGRAM!

## 2016-01-28 NOTE — Progress Notes (Signed)
Subjective:  Patient ID: Evelyn Graham, female    DOB: 16-Jul-1960  Age: 55 y.o. MRN: [id number]  CC: The primary encounter diagnosis was Hyperlipidemia LDL goal <100. Diagnoses of Essential hypertension, Fatigue, unspecified type, and Pure hypercholesterolemia were also pertinent to this visit.  HPI [employer] presents for follow up hypertension, hyperlipidemia.  Last seen in June. Patient is taking her medications as prescribed and notes no adverse effects.  Home BP readings have been done about once per week and are  generally < 130/80 .  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 Waiting until after th holidays to sart     Received flu vaccine. TDAP July 2015  discussed Shingrix vaccine.  Husband had facial shingles , very painful       Outpatient Medications Prior to Visit  Medication Sig Dispense Refill  . aspirin 81 MG tablet Take 81 mg by mouth daily.    . Glucosamine HCl 1000 MG TABS Take 4,000 mg by mouth daily.    . norethindrone-ethinyl estradiol (FEMHRT LOW DOSE) 0.5-2.5 MG-MCG tablet Take 1 tablet by mouth daily. 90 tablet 2  . atorvastatin (LIPITOR) 20 MG tablet Take 20 mg by mouth at bedtime.  2  . metoprolol succinate (TOPROL-XL) 50 MG 24 hr tablet Take 50 mg by mouth daily.  2  . OnabotulinumtoxinA (BOTOX IJ) Inject 2 vials as directed every 6 (six) months.     No facility-administered medications prior to visit.     Review of Systems;  Patient denies headache, fevers, malaise, unintentional weight loss, skin rash, eye pain,  sinus congestion and sinus pain, sore throat, dysphagia,  hemoptysis , cough, dyspnea, wheezing, chest pain, palpitations, orthopnea, edema, abdominal pain, nausea, melena, diarrhea, constipation, flank pain, dysuria, hematuria, urinary  Frequency, nocturia, numbness, tingling, seizures,  Focal weakness, Loss of consciousness,  Tremor, insomnia, depression, anxiety, and suicidal ideation.      Objective:  BP 128/78   Pulse (!) 57   Temp 98.3 F (36.8 C) (Oral)   Resp 12   Ht 5\' 4"  (1.626 m)   Wt 142 lb 4 oz (64.5 kg)   SpO2 100%   BMI 24.42 kg/m   BP Readings from Last 3 Encounters:  01/28/16 128/78  11/21/15 119/63  09/25/15 (!) 146/80    Wt Readings from Last 3 Encounters:  01/28/16 142 lb 4 oz (64.5 kg)  11/21/15 135 lb (61.2 kg)  09/25/15 142 lb (64.4 kg)    General appearance: alert, cooperative and appears stated age Ears: normal TM's and external ear canals both ears Throat: lips, mucosa, and tongue normal; teeth and gums normal Neck: no adenopathy, no carotid bruit, supple, symmetrical, trachea midline and thyroid not enlarged, symmetric, no tenderness/mass/nodules Back: symmetric, no curvature. ROM normal. No CVA tenderness. Lungs: clear to auscultation bilaterally Heart: regular rate and rhythm, S1, S2 normal, no murmur, click, rub or gallop Abdomen: soft, non-tender; bowel sounds normal; no masses,  no organomegaly Pulses: 2+ and symmetric Skin: Skin color, texture, turgor normal. No rashes or lesions  Lymph nodes: Cervical, supraclavicular, and axillary nodes normal.  No results found for: HGBA1C  Lab Results  Component Value Date   CREATININE 0.79 08/28/2015    Lab Results  Component Value Date   GLUCOSE 104 (H) 08/28/2015   LDLDIRECT 81.0 08/28/2015   ALT 26 08/28/2015   AST 19 08/28/2015   NA 140 08/28/2015   K 3.7 08/28/2015   CL 104 08/28/2015   CREATININE 0.79 08/28/2015   BUN 8 08/28/2015   CO2 25 08/28/2015   TSH 1.48 08/28/2015    No  results found.  Assessment & Plan:   Problem List Items Addressed This Visit    Hyperlipidemia    Treating aggressively due to carotid stenosis.    Lab Results  Component Value Date   LDLDIRECT 81.0 08/28/2015   Lab Results  Component Value Date   ALT 26 08/28/2015   AST 19 08/28/2015   ALKPHOS 88 08/28/2015   BILITOT 1.2 08/28/2015         Relevant Medications   atorvastatin (LIPITOR) 20 MG tablet   metoprolol succinate (TOPROL-XL) 50 MG 24 hr tablet   Essential hypertension    Well controlled on current regimen. Renal function stable, no changes today.      Relevant Medications   atorvastatin (LIPITOR) 20 MG tablet   metoprolol succinate (TOPROL-XL) 50 MG 24 hr tablet   Other Relevant Orders   Comprehensive metabolic panel    Other Visit Diagnoses    Hyperlipidemia LDL goal <100    -  Primary   Relevant Medications   atorvastatin (LIPITOR) 20 MG tablet   metoprolol succinate (TOPROL-XL) 50 MG 24 hr tablet   Other Relevant Orders   Lipid panel   Fatigue, unspecified type       Relevant Orders   Hepatitis C antibody   HIV antibody      I have changed Ms. Andon's atorvastatin and metoprolol succinate. I am also having her maintain her aspirin, Glucosamine HCl, OnabotulinumtoxinA (BOTOX IJ), and norethindrone-ethinyl estradiol.  Meds ordered this encounter  Medications  . atorvastatin (LIPITOR) 20 MG tablet    Sig: Take 1 tablet (20 mg total) by mouth at bedtime.    Dispense:  90 tablet    Refill:  1  . metoprolol succinate (TOPROL-XL) 50 MG 24 hr tablet    Sig: Take 1 tablet (50 mg total) by mouth daily.    Dispense:  90 tablet    Refill:  1    Medications Discontinued During This Encounter  Medication Reason  . atorvastatin (LIPITOR) 20 MG tablet Reorder  . metoprolol succinate (TOPROL-XL) 50 MG 24 hr tablet Reorder    Follow-up: No Follow-up on file.   Crecencio Mc, MD

## 2016-01-29 NOTE — Assessment & Plan Note (Signed)
Treating aggressively due to carotid stenosis.    Lab Results  Component Value Date   LDLDIRECT 81.0 08/28/2015   Lab Results  Component Value Date   ALT 26 08/28/2015   AST 19 08/28/2015   ALKPHOS 88 08/28/2015   BILITOT 1.2 08/28/2015

## 2016-01-29 NOTE — Assessment & Plan Note (Signed)
Well controlled on current regimen. Renal function stable, no changes today. 

## 2016-01-30 ENCOUNTER — Other Ambulatory Visit (INDEPENDENT_AMBULATORY_CARE_PROVIDER_SITE_OTHER): Payer: Federal, State, Local not specified - PPO

## 2016-01-30 DIAGNOSIS — E785 Hyperlipidemia, unspecified: Secondary | ICD-10-CM

## 2016-01-30 DIAGNOSIS — I10 Essential (primary) hypertension: Secondary | ICD-10-CM | POA: Diagnosis not present

## 2016-01-30 DIAGNOSIS — R5383 Other fatigue: Secondary | ICD-10-CM

## 2016-01-30 LAB — COMPREHENSIVE METABOLIC PANEL
ALBUMIN: 4.2 g/dL (ref 3.5–5.2)
ALK PHOS: 82 U/L (ref 39–117)
ALT: 19 U/L (ref 0–35)
AST: 13 U/L (ref 0–37)
BILIRUBIN TOTAL: 1.1 mg/dL (ref 0.2–1.2)
BUN: 13 mg/dL (ref 6–23)
CALCIUM: 9.1 mg/dL (ref 8.4–10.5)
CO2: 25 mEq/L (ref 19–32)
CREATININE: 0.71 mg/dL (ref 0.40–1.20)
Chloride: 107 mEq/L (ref 96–112)
GFR: 91.45 mL/min (ref >60.00)
Glucose, Bld: 92 mg/dL (ref 70–99)
Potassium: 4.2 mEq/L (ref 3.5–5.1)
Sodium: 140 mEq/L (ref 135–145)
Total Protein: 6.8 g/dL (ref 6.0–8.3)

## 2016-01-30 LAB — LIPID PANEL
CHOLESTEROL: 147 mg/dL (ref 0–200)
HDL: 63.2 mg/dL (ref >39.00)
LDL Cholesterol: 70 mg/dL (ref 0–99)
NonHDL: 83.7
TRIGLYCERIDES: 68 mg/dL (ref 0.0–149.0)
Total CHOL/HDL Ratio: 2
VLDL: 13.6 mg/dL (ref 0.0–40.0)

## 2016-01-31 LAB — HEPATITIS C ANTIBODY: HCV AB: NEGATIVE

## 2016-01-31 LAB — HIV ANTIBODY (ROUTINE TESTING W REFLEX): HIV 1&2 Ab, 4th Generation: NONREACTIVE

## 2016-03-05 ENCOUNTER — Ambulatory Visit
Admission: RE | Admit: 2016-03-05 | Discharge: 2016-03-05 | Disposition: A | Discharge status: Home / Self Care | Payer: Federal, State, Local not specified - PPO | Source: Ambulatory Visit | Attending: Internal Medicine | Admitting: Internal Medicine

## 2016-03-05 DIAGNOSIS — Z1231 Encounter for screening mammogram for malignant neoplasm of breast: Secondary | ICD-10-CM | POA: Insufficient documentation

## 2016-03-05 DIAGNOSIS — Z1239 Encounter for other screening for malignant neoplasm of breast: Secondary | ICD-10-CM

## 2016-03-12 ENCOUNTER — Inpatient Hospital Stay
Admission: RE | Admit: 2016-03-12 | Discharge: 2016-03-12 | Disposition: A | Discharge status: Home / Self Care | Payer: Self-pay | Source: Ambulatory Visit | Attending: *Deleted | Admitting: *Deleted

## 2016-03-12 ENCOUNTER — Other Ambulatory Visit: Payer: Self-pay | Admitting: *Deleted

## 2016-03-12 DIAGNOSIS — Z9289 Personal history of other medical treatment: Secondary | ICD-10-CM

## 2016-05-16 ENCOUNTER — Other Ambulatory Visit: Payer: Self-pay | Admitting: Internal Medicine

## 2016-07-22 ENCOUNTER — Other Ambulatory Visit: Payer: Self-pay | Admitting: Internal Medicine

## 2016-07-28 ENCOUNTER — Ambulatory Visit: Payer: Federal, State, Local not specified - PPO | Admitting: Internal Medicine

## 2016-08-06 ENCOUNTER — Other Ambulatory Visit: Payer: Self-pay | Admitting: Internal Medicine

## 2016-08-26 ENCOUNTER — Ambulatory Visit
Admission: EM | Admit: 2016-08-26 | Discharge: 2016-08-26 | Disposition: A | Discharge status: Home / Self Care | Payer: Federal, State, Local not specified - PPO | Attending: Family Medicine | Admitting: Family Medicine

## 2016-08-26 ENCOUNTER — Ambulatory Visit (INDEPENDENT_AMBULATORY_CARE_PROVIDER_SITE_OTHER)
Admit: 2016-08-26 | Discharge: 2016-08-26 | Disposition: A | Discharge status: Home / Self Care | Payer: Federal, State, Local not specified - PPO | Attending: Family Medicine | Admitting: Family Medicine

## 2016-08-26 ENCOUNTER — Encounter: Payer: Self-pay | Admitting: Emergency Medicine

## 2016-08-26 DIAGNOSIS — R229 Localized swelling, mass and lump, unspecified: Secondary | ICD-10-CM | POA: Diagnosis not present

## 2016-08-26 DIAGNOSIS — M791 Myalgia: Secondary | ICD-10-CM | POA: Diagnosis not present

## 2016-08-26 DIAGNOSIS — M7918 Myalgia, other site: Secondary | ICD-10-CM

## 2016-08-26 DIAGNOSIS — L0231 Cutaneous abscess of buttock: Secondary | ICD-10-CM

## 2016-08-26 DIAGNOSIS — K6289 Other specified diseases of anus and rectum: Secondary | ICD-10-CM

## 2016-08-26 MED ORDER — HYDROCODONE-ACETAMINOPHEN 5-325 MG PO TABS: ORAL_TABLET | ORAL | 0 refills | Status: DC

## 2016-08-26 MED ORDER — DOXYCYCLINE HYCLATE 100 MG PO TABS: 100.0000 mg | ORAL_TABLET | Freq: Two times a day (BID) | ORAL | 0 refills | Status: DC

## 2016-08-26 NOTE — ED Triage Notes (Signed)
Patient c/o tender mass in her left buttock that started a bout a week ago and has progressively gotten worse.

## 2016-08-26 NOTE — Discharge Instructions (Signed)
Follow up with general surgeon tomorrow

## 2016-08-26 NOTE — ED Provider Notes (Signed)
[facility]    CSN: [id number] Arrival date & time: 08/26/16  0825     History   Chief Complaint Chief Complaint  Patient presents with  . Abscess    HPI Evelyn Graham is a 56 y.o. female.   56 yo female with a c/o left buttock tender mass for the past 4-5 days. Mass is tender and deep in the buttock. Patient denies any trauma, injury, fevers, chills, redness of skin, drainage, melena, hematochezia. Pain is worse when sitting or putting pressure on the area. States has only been taking tylenol or advil for pain.    The history is provided by the patient.  Abscess    Past Medical History:  Diagnosis Date  . 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  . Carotid stenosis 08/30/2015  . Chicken pox 1969  . Encounter for screening colonoscopy 09/25/2015  . Essential hypertension 08/30/2015  . External hemorrhoid   . Heart murmur 2008  . 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 Hyperhydrosis disorder   . Hyperlipidemia   . Menopause syndrome 08/30/2015  . Other fatigue 08/30/2015  . Vitamin D deficiency 08/30/2015    Patient Active Problem List   Diagnosis Date Noted  . Encounter for screening colonoscopy 09/25/2015  . Vitamin D deficiency 08/30/2015  . Hyperlipidemia 08/30/2015  . Other fatigue 08/30/2015  . 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  . Essential hypertension 08/30/2015  . Carotid stenosis 08/30/2015  . Menopause syndrome 08/30/2015  . Hypercholesterolemia 03/17/2006    Past Surgical History:  Procedure Laterality Date  . COLONOSCOPY WITH PROPOFOL N/A 11/21/2015   Procedure: COLONOSCOPY WITH PROPOFOL;  Surgeon: [name], MD;  Location: [facility] ENDOSCOPY;  Service: Endoscopy;  Laterality: N/A;  . GANGLION CYST EXCISION Left 2007    OB History    Gravida Para Term Preterm AB Living   1 0     1     SAB TAB Ectopic Multiple Live Births   1              Obstetric Comments   1st Menstrual  Cycle:  16  1st Pregnancy:  25        Home Medications    Prior to Admission medications   Medication Sig Start Date End Date Taking? Authorizing Provider  loratadine (CLARITIN) 10 MG tablet Take 10 mg by mouth daily.   Yes [provider]  aspirin 81 MG tablet Take 81 mg by mouth daily.    [provider]  atorvastatin (LIPITOR) 20 MG tablet TAKE 1 TABLET (20 MG TOTAL) BY MOUTH AT BEDTIME. 07/22/16   Crecencio Mc, MD  doxycycline (VIBRA-TABS) 100 MG tablet Take 1 tablet (100 mg total) by mouth 2 (two) times daily. 08/26/16   Norval Gable, MD  Glucosamine HCl 1000 MG TABS Take 4,000 mg by mouth daily.    [provider]  HYDROcodone-acetaminophen (NORCO/VICODIN) 5-325 MG tablet 1-2 tabs po q 8 hours prn 08/26/16   Norval Gable, MD  metoprolol succinate (TOPROL-XL) 50 MG 24 hr tablet TAKE 1 TABLET (50 MG TOTAL) BY MOUTH DAILY. 07/22/16   Crecencio Mc, MD  norethindrone-ethinyl estradiol (FEMHRT LOW DOSE) 0.5-2.5 MG-MCG tablet TAKE 1 TABLET BY MOUTH DAILY. 08/06/16   Crecencio Mc, MD  OnabotulinumtoxinA (BOTOX IJ) Inject 2 vials as directed every 6 (six) months.    [provider]    Family History Family History  Problem Relation Age of Onset  .  Hyperlipidemia Mother   . Paget's disease of bone Mother   . Heart disease Father 52       early forties  . Hyperlipidemia Father   . Hypertension Father   . Cancer Maternal Uncle 60       colon  . Heart disease Maternal Grandmother   . Heart disease Maternal Grandfather   . Cancer Paternal Grandmother        stomach cancer?  . Cancer Paternal Grandfather        stomach cancer?    Social History Social History  Substance Use Topics  . Smoking status: Never Smoker  . Smokeless tobacco: Never Used  . Alcohol use 0.0 oz/week     Comment: 3 to 4 glasses of wine per week     Allergies   Sulfa antibiotics   Review of Systems Review of Systems   Physical Exam Triage Vital Signs ED  Triage Vitals  Enc Vitals Group     BP 08/26/16 0839 (!) 159/65     Pulse Rate 08/26/16 0839 80     Resp 08/26/16 0839 16     Temp 08/26/16 0839 98.4 F (36.9 C)     Temp Source 08/26/16 0839 Oral     SpO2 08/26/16 0839 100 %     Weight 08/26/16 0836 140 lb (63.5 kg)     Height 08/26/16 0836 5\' 4"  (1.626 m)     Head Circumference --      Peak Flow --      Pain Score 08/26/16 0837 5     Pain Loc --      Pain Edu? --      Excl. in [place]? --    No data found.   Updated Vital Signs BP (!) 159/65 (BP Location: Left Arm)   Pulse 80   Temp 98.4 F (36.9 C) (Oral)   Resp 16   Ht 5\' 4"  (1.626 m)   Wt 140 lb (63.5 kg)   SpO2 100%   BMI 24.03 kg/m   Visual Acuity Right Eye Distance:   Left Eye Distance:   Bilateral Distance:    Right Eye Near:   Left Eye Near:    Bilateral Near:     Physical Exam  Constitutional: She appears well-developed and well-nourished. No distress.  Abdominal: Soft. Bowel sounds are normal. She exhibits mass (left buttock).  Genitourinary: Rectum normal. Rectal exam shows no internal hemorrhoid, no mass, no tenderness and anal tone normal.     Genitourinary Comments: Left inner buttock with a tender subcutaneous mass approx 4x3cm well circumscribed; NO superficial skin erythema, warmth or tenderness  Skin: She is not diaphoretic.  Nursing note and vitals reviewed.    UC Treatments / Results  Labs (all labs ordered are listed, but only abnormal results are displayed) Labs Reviewed - No data to display  EKG  EKG Interpretation None       Radiology US Pelvis Limited  Result Date: 08/26/2016 CLINICAL DATA:  Left buttock mass for 6 days.  Mass is painful. EXAM: LIMITED ULTRASOUND OF PELVIS TECHNIQUE: Limited transabdominal ultrasound examination of the pelvis was performed. COMPARISON:  None. FINDINGS: There is irregular complicated fluid collection in the subcutaneous soft tissues of the inferior left buttock extending towards the perianal  soft tissues. It measures approximate 4.0 x 2.4 x 2.7 cm. Surrounding fat has increased echogenicity. IMPRESSION: 1. Irregular complicated fluid collection corresponds to the palpable abnormality along the inferior left buttock. This is likely an abscess. It extends  towards the perianal soft tissues. Electronically Signed   By: Lajean Manes M.D.   On: 08/26/2016 10:24    Procedures Procedures (including critical care time)  Medications Ordered in UC Medications - No data to display   Initial Impression / Assessment and Plan / UC Course  I have reviewed the triage vital signs and the nursing notes.  Pertinent labs & imaging results that were available during my care of the patient were reviewed by me and considered in my medical decision making (see chart for details).       Final Clinical Impressions(s) / UC Diagnoses   Final diagnoses:  Gluteal pain  Mass of skin  Abscess of buttock, left  Left buttock subcutaneous deep mass  New Prescriptions Discharge Medication List as of 08/26/2016 10:40 AM    START taking these medications   Details  doxycycline (VIBRA-TABS) 100 MG tablet Take 1 tablet (100 mg total) by mouth 2 (two) times daily., Starting Tue 08/26/2016, Normal    HYDROcodone-acetaminophen (NORCO/VICODIN) 5-325 MG tablet 1-2 tabs po q 8 hours prn, Print       1. usresults and diagnosis reviewed with patient 2. rx as per orders above; reviewed possible side effects, interactions, risks and benefits  3. Recommend supportive treatment with warm compresses to area 4. Recommend follow up with general surgeon for further evaluation and management; appointment scheduled with general surgeon for tommorow   Norval Gable, MD 08/26/16 1335

## 2016-08-27 ENCOUNTER — Ambulatory Visit (INDEPENDENT_AMBULATORY_CARE_PROVIDER_SITE_OTHER): Payer: Federal, State, Local not specified - PPO | Admitting: Surgery

## 2016-08-27 ENCOUNTER — Telehealth: Payer: Self-pay

## 2016-08-27 ENCOUNTER — Other Ambulatory Visit: Payer: Self-pay

## 2016-08-27 ENCOUNTER — Other Ambulatory Visit
Admission: RE | Admit: 2016-08-27 | Discharge: 2016-08-27 | Disposition: A | Discharge status: Home / Self Care | Payer: Federal, State, Local not specified - PPO | Source: Other Acute Inpatient Hospital | Attending: Surgery | Admitting: Surgery

## 2016-08-27 ENCOUNTER — Encounter: Payer: Self-pay | Admitting: Surgery

## 2016-08-27 VITALS — BP 134/84 | HR 97 | Temp 99.9°F | Ht 64.0 in | Wt 145.4 lb

## 2016-08-27 DIAGNOSIS — K610 Anal abscess: Secondary | ICD-10-CM | POA: Insufficient documentation

## 2016-08-27 NOTE — Telephone Encounter (Signed)
Culture carried to [facility] lab and given to [facility] at this time.

## 2016-08-27 NOTE — Patient Instructions (Addendum)
Please see the instructions below for taking a sitz bath. Please continue to take your Medicine until all finished. Please see your follow up appointment listed below. Please remove the packing from the wound tomorrow.   How to Take a Sitz Bath A sitz bath is a warm water bath that is taken while you are sitting down. The water should only come up to your hips and should cover your buttocks. Your health care provider may recommend a sitz bath to help you:  Clean the lower part of your body, including your genital area.  With itching.  With pain.  With sore muscles or muscles that tighten or spasm.  How to take a sitz bath Take 3-4 sitz baths per day or as told by your health care provider. 1. Partially fill a bathtub with warm water. You will only need the water to be deep enough to cover your hips and buttocks when you are sitting in it. 2. If your health care provider told you to put medicine in the water, follow the directions exactly. 3. Sit in the water and open the tub drain a little. 4. Turn on the warm water again to keep the tub at the correct level. Keep the water running constantly. 5. Soak in the water for 15-20 minutes or as told by your health care provider. 6. After the sitz bath, pat the affected area dry first. Do not rub it. 7. Be careful when you stand up after the sitz bath because you may feel dizzy.  Contact a health care provider if:  Your symptoms get worse. Do not continue with sitz baths if your symptoms get worse.  You have new symptoms. Do not continue with sitz baths until you talk with your health care provider. This information is not intended to replace advice given to you by your health care provider. Make sure you discuss any questions you have with your health care provider. Document Released: 11/24/2003 Document Revised: 08/01/2015 Document Reviewed: 03/01/2014 Elsevier Interactive Patient Education  [employer].

## 2016-08-27 NOTE — Progress Notes (Signed)
Surgical Clinic History and Physical + Procedure Note  Referring provider:  Crecencio Mc, MD [address] [place], [place] [address]  HISTORY OF PRESENT ILLNESS (HPI):  56 y.o. female presents for evaluation of worsening Left buttock pain x 6 days, for which she presented yesterday to urgent care facility where ultrasound was performed and diagnosed anorectal abscess which appears to track deep along the patient's rectum. Patient reports she has been taking the doxycycline as prescribed yesterday and otherwise denies any fever/chills, constipation or diarrhea, N/V, abdominal pain or distention, CP, or SOB. She also denies any prior similar episodes.  PAST MEDICAL HISTORY (PMH):  Past Medical History:  Diagnosis Date  . 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  . Carotid stenosis 08/30/2015  . Chicken pox 1969  . Encounter for screening colonoscopy 09/25/2015  . Essential hypertension 08/30/2015  . External hemorrhoid   . Heart murmur 2008  . 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 Hyperhydrosis disorder   . Hyperlipidemia   . Menopause syndrome 08/30/2015  . Other fatigue 08/30/2015  . Vitamin D deficiency 08/30/2015     PAST SURGICAL HISTORY ([place]):  Past Surgical History:  Procedure Laterality Date  . COLONOSCOPY WITH PROPOFOL N/A 11/21/2015   Procedure: COLONOSCOPY WITH PROPOFOL;  Surgeon: [name], MD;  Location: [facility] ENDOSCOPY;  Service: Endoscopy;  Laterality: N/A;  . GANGLION CYST EXCISION Left 2007     MEDICATIONS:  Prior to Admission medications   Medication Sig Start Date End Date Taking? Authorizing Provider  aspirin 81 MG tablet Take 81 mg by mouth daily.   Yes [provider]  atorvastatin (LIPITOR) 20 MG tablet TAKE 1 TABLET (20 MG TOTAL) BY MOUTH AT BEDTIME. 07/22/16  Yes Crecencio Mc, MD  doxycycline (VIBRA-TABS) 100 MG tablet Take 1 tablet (100 mg total) by mouth 2 (two) times daily. 08/26/16  Yes Conty, Linward Foster, MD  Glucosamine HCl  1000 MG TABS Take 4,000 mg by mouth daily.   Yes [provider]  loratadine (CLARITIN) 10 MG tablet Take 10 mg by mouth daily.   Yes [provider]  metoprolol succinate (TOPROL-XL) 50 MG 24 hr tablet TAKE 1 TABLET (50 MG TOTAL) BY MOUTH DAILY. 07/22/16  Yes Crecencio Mc, MD  norethindrone-ethinyl estradiol (FEMHRT LOW DOSE) 0.5-2.5 MG-MCG tablet TAKE 1 TABLET BY MOUTH DAILY. 08/06/16  Yes Crecencio Mc, MD  OnabotulinumtoxinA (BOTOX IJ) Inject 2 vials as directed every 6 (six) months.   Yes [provider]     ALLERGIES:  Allergies  Allergen Reactions  . Sulfa Antibiotics Anaphylaxis     SOCIAL HISTORY:  Social History   Social History  . Marital status: Married    Spouse name: N/A  . Number of children: N/A  . Years of education: N/A   Occupational History  . Not on file.   Social History Main Topics  . Smoking status: Never Smoker  . Smokeless tobacco: Never Used  . Alcohol use 0.0 oz/week     Comment: 3 to 4 glasses of wine per week  . Drug use: No  . Sexual activity: Yes   Other Topics Concern  . Not on file   Social History Narrative  . No narrative on file    The patient currently resides (home / rehab facility / nursing home): Home  The patient normally is (ambulatory / bedbound) : Ambulatory   FAMILY HISTORY:  Family History  Problem Relation Age of Onset  . Hyperlipidemia Mother   .  Paget's disease of bone Mother   . Heart disease Father 45       early forties  . Hyperlipidemia Father   . Hypertension Father   . Cancer Maternal Uncle 60       colon  . Heart disease Maternal Grandmother   . Heart disease Maternal Grandfather   . Cancer Paternal Grandmother        stomach cancer?  . Cancer Paternal Grandfather        stomach cancer?    Otherwise negative/non-contributory.  REVIEW OF SYSTEMS:  Constitutional: denies any other weight loss, fever, chills, or sweats  Eyes: denies any other vision changes, history of  eye injury  ENT: denies sore throat, hearing problems  Respiratory: denies shortness of breath, wheezing  Cardiovascular: denies chest pain, palpitations  Gastrointestinal: denies abdominal pain, N/V, or diarrhea except Left buttock pain as per HPI Musculoskeletal: denies any other joint pains or cramps  Skin: Denies any other rashes or skin discolorations  Neurological: denies any other headache, dizziness, weakness  Psychiatric: Denies any other depression, anxiety   All other review of systems were otherwise negative   VITAL SIGNS:  @VSRANGES @     Height: 5\' 4"  (162.6 cm) Weight: 145 lb 6.4 oz (66 kg) BMI (Calculated): 25   PHYSICAL EXAM:  Constitutional:  -- Normal body habitus  -- Awake, alert, and oriented x3  Eyes:  -- Pupils equally round and reactive to light  -- No scleral icterus  Ear, nose, throat:  -- No jugular venous distension -- No nasal drainage, bleeding Pulmonary:  -- No crackles  -- Equal breath sounds bilaterally -- Breathing non-labored at rest Cardiovascular:  -- S1, S2 present  -- No pericardial rubs  Gastrointestinal:  -- Abdomen soft, nontender, nondistended, no guarding/rebound  -- No abdominal masses appreciated, pulsatile or otherwise  Anorectal: -- Focally tender firm and fluctuant area without erythema at the 9 o'clock position along patient's Left peri-anal buttock -- Normal anal sphincter tone with no gross blood or evidence of fissure or fistula, +single NT external hemorrhoid Musculoskeletal and Integumentary:  -- Wounds or skin discoloration: None except as described above (GI) -- Extremities: B/L UE and LE FROM, hands and feet warm, no edema  Neurologic:  -- Motor function: Intact and symmetric -- Sensation: Intact and symmetric  Labs:  CBC: No results found for: WBC, RBC BMP:  Lab Results  Component Value Date   GLUCOSE 92 01/30/2016   CO2 25 01/30/2016   BUN 13 01/30/2016   CREATININE 0.71 01/30/2016   CALCIUM 9.1  01/30/2016     Imaging studies:  Limited Pelvic Ultrasound (08/26/2016) There is irregular complicated fluid collection in the subcutaneous soft tissues of the inferior left buttock extending towards the perianal soft tissues. It measures approximate 4.0 x 2.4 x 2.7 cm. Surrounding fat has increased echogenicity.  Assessment/Plan:  56 y.o. 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   - all risks, benefits, and alternatives to drainage of anorectal abscess were discussed with the patient, all of her questions were answered to her expressed satisfaction, patient expresses she wished to proceed, and informed consent was obtained.   - peri-procedural site prepped and draped in the usual fashion and following a brief timeout, local anesthetic was injected over the planned incision/drainage site, small amount of purulent fluid was aspirated to  confirm anticipated drainage site, and 2 cm linear circum-anal incision was made using a #11 blade scalpel, releasing ~20 mL of foul-smelling green-brown purulent fluid. A culture swab was then inserted and used to disrupt the internal loculation, releasing an additional 30 mL before culture swab was inserted into culture media, and hemostat was used to disrupt additional inter-cavity septations with a small amount of additional very foul-smelling green-brown purulent fluid obtained and significant relief of pain/pressure for the patient, after which wound packing was inserted  - complete course of prescribed antibiotics even if feeling better  - remove packing tomorrow, sitz baths for comfort + hygiene  - return to clinic in 1 week, call if any questions/concerns  All of the above recommendations were discussed with the patient, and all of patient's questions were answered to  her expressed satisfaction.  Thank you for the opportunity to participate in this patient's care.  -- Marilynne Drivers Rosana Hoes, MD, [place]: [facility] for exceptional care. Office: [phone]

## 2016-08-29 ENCOUNTER — Telehealth: Payer: Self-pay

## 2016-08-29 NOTE — Telephone Encounter (Signed)
Patient notified of culture results, instructed to continue doxycyline. Patient reminded of follow up appointment next week. Patient stated she had minimal drainage and is continuing with sitz baths. Minimal pain at this time.

## 2016-08-30 LAB — AEROBIC CULTURE  (SUPERFICIAL SPECIMEN)

## 2016-08-30 LAB — AEROBIC CULTURE W GRAM STAIN (SUPERFICIAL SPECIMEN)

## 2016-09-03 ENCOUNTER — Ambulatory Visit: Payer: Federal, State, Local not specified - PPO | Admitting: Surgery

## 2016-09-04 ENCOUNTER — Encounter: Payer: Self-pay | Admitting: Surgery

## 2016-09-04 ENCOUNTER — Ambulatory Visit (INDEPENDENT_AMBULATORY_CARE_PROVIDER_SITE_OTHER): Payer: Federal, State, Local not specified - PPO | Admitting: Surgery

## 2016-09-04 VITALS — BP 131/80 | HR 61 | Temp 98.7°F | Ht 64.0 in | Wt 144.0 lb

## 2016-09-04 DIAGNOSIS — Z09 Encounter for follow-up examination after completed treatment for conditions other than malignant neoplasm: Secondary | ICD-10-CM

## 2016-09-04 NOTE — Patient Instructions (Signed)
Perirectal Abscess An abscess is an infected area that contains a collection of pus. A perirectal abscess is an abscess that is near the opening of the anus or around the rectum. A perirectal abscess can cause a lot of pain, especially during bowel movements. What are the causes? This condition is almost always caused by an infection that starts in an anal gland. What increases the risk? This condition is more likely to develop in:  People with diabetes or inflammatory bowel disease.  People whose body defense system (immune system) is weak.  People who have anal sex.  People who have a sexually transmitted disease (STD).  People who have certain kinds of cancers, such as rectal carcinoma, leukemia, or lymphoma.  What are the signs or symptoms? The main symptom of this condition is pain. The pain may be a throbbing pain that gets worse during bowel movements. Other symptoms include:  Fever.  Swelling.  Redness.  Bleeding.  Constipation.  How is this diagnosed? The condition is diagnosed with a physical exam. If the abscess is not visible, a health care provider may need to place a finger inside the rectum to find the abscess. Sometimes, imaging tests are done to determine the size and location of the abscess. These tests may include:  An ultrasound.  An MRI.  A CT scan.  How is this treated? This condition is usually treated with incision and drainage surgery. Incision and drainage surgery involves making an incision over the abscess to drain the pus. Treatment may also involve antibiotic medicine, pain medicine, stool softeners, or laxatives. Follow these instructions at home:  Take medicines only as directed by your health care provider.  If you were prescribed an antibiotic, finish all of it even if you start to feel better.  To relieve pain, try sitting: ? In a warm, shallow bath (sitz bath). ? On a heating pad with the setting on low. ? On an inflatable  donut-shaped cushion.  Follow any diet instructions as directed by your health care provider.  Keep all follow-up visits as directed by your health care provider. This is important. Contact a health care provider if:  Your abscess is bleeding.  You have pain, swelling, or redness that is getting worse.  You are constipated.  You feel ill.  You have muscle aches or chills.  You have a fever.  Your symptoms return after the abscess has healed. This information is not intended to replace advice given to you by your health care provider. Make sure you discuss any questions you have with your health care provider. Document Released: 02/29/2000 Document Revised: 08/09/2015 Document Reviewed: 01/11/2014 Elsevier Interactive Patient Education  2018 [employer] Inc.  

## 2016-09-04 NOTE — Progress Notes (Signed)
S/p I/D perianal abscess. Doing well  No complaints  PE NAD Wound healing well, no perineal sepsis . No infection  A/p Doing well Continue sitz baths and wound care F/U prn

## 2016-09-09 ENCOUNTER — Ambulatory Visit (INDEPENDENT_AMBULATORY_CARE_PROVIDER_SITE_OTHER): Payer: Federal, State, Local not specified - PPO | Admitting: Internal Medicine

## 2016-09-09 ENCOUNTER — Encounter: Payer: Self-pay | Admitting: Internal Medicine

## 2016-09-09 VITALS — BP 118/64 | HR 71 | Temp 98.2°F | Resp 15 | Ht 64.0 in | Wt 145.8 lb

## 2016-09-09 DIAGNOSIS — E7800 Pure hypercholesterolemia, unspecified: Secondary | ICD-10-CM | POA: Diagnosis not present

## 2016-09-09 DIAGNOSIS — Z79899 Other long term (current) drug therapy: Secondary | ICD-10-CM | POA: Diagnosis not present

## 2016-09-09 DIAGNOSIS — I10 Essential (primary) hypertension: Secondary | ICD-10-CM

## 2016-09-09 DIAGNOSIS — L0231 Cutaneous abscess of buttock: Secondary | ICD-10-CM

## 2016-09-09 NOTE — Patient Instructions (Addendum)
Your wound is granulating well but still has a way to go before it has a layer of skin  Ideally if you can protect it with a non stick pad (Telfa) tha tyou can keep in place,  It will protect the wound from contamination  Consider using anti bacterial soap   Or "wound wash" sterile solution after your shower

## 2016-09-09 NOTE — Progress Notes (Signed)
Subjective:  Patient ID: Evelyn Graham, female    DOB: May 21, 1960  Age: 56 y.o. MRN: [id number]  CC: The primary encounter diagnosis was Long-term use of high-risk medication. Diagnoses of Pure hypercholesterolemia, Essential hypertension, and Abscess of buttock, left were also pertinent to this visit.  HPI Evelyn Graham presents for follow up on hypertension and hyperlipidemia  Was treated at Urgent Care several weeks ago for abscessed boil on left buttock that developed after a long day of physical labor outside . She was given empiric antibiotics and referred to general  surgery for incision and drainage.  The wound was packed with iodoform gauze which was removed per directive several days later .  Her husband has been helping her, but she has not kept wound covered due to the proximity of the ulcer to her anus . She has ntoted some discharge but denies pain and fevers .    Outpatient Medications Prior to Visit  Medication Sig Dispense Refill  . aspirin 81 MG tablet Take 81 mg by mouth daily.    Marland Kitchen atorvastatin (LIPITOR) 20 MG tablet TAKE 1 TABLET (20 MG TOTAL) BY MOUTH AT BEDTIME. 90 tablet 1  . Glucosamine HCl 1000 MG TABS Take 4,000 mg by mouth daily.    Marland Kitchen loratadine (CLARITIN) 10 MG tablet Take 10 mg by mouth daily.    . metoprolol succinate (TOPROL-XL) 50 MG 24 hr tablet TAKE 1 TABLET (50 MG TOTAL) BY MOUTH DAILY. 90 tablet 1  . norethindrone-ethinyl estradiol (FEMHRT LOW DOSE) 0.5-2.5 MG-MCG tablet TAKE 1 TABLET BY MOUTH DAILY. 90 tablet 0  . doxycycline (VIBRA-TABS) 100 MG tablet Take 1 tablet (100 mg total) by mouth 2 (two) times daily. (Patient not taking: Reported on 09/09/2016) 20 tablet 0   No facility-administered medications prior to visit.     Review of Systems;  Patient denies headache, fevers, malaise, unintentional weight loss, skin rash, eye pain, sinus congestion and sinus pain, sore throat, dysphagia,  hemoptysis , cough, dyspnea, wheezing, chest  pain, palpitations, orthopnea, edema, abdominal pain, nausea, melena, diarrhea, constipation, flank pain, dysuria, hematuria, urinary  Frequency, nocturia, numbness, tingling, seizures,  Focal weakness, Loss of consciousness,  Tremor, insomnia, depression, anxiety, and suicidal ideation.      Objective:  BP 118/64 (BP Location: Left Arm, Patient Position: Sitting, Cuff Size: Normal)   Pulse 71   Temp 98.2 F (36.8 C) (Oral)   Resp 15   Ht 5\' 4"  (1.626 m)   Wt 145 lb 12.8 oz (66.1 kg)   SpO2 99%   BMI 25.03 kg/m   BP Readings from Last 3 Encounters:  09/09/16 118/64  09/04/16 131/80  08/27/16 134/84    Wt Readings from Last 3 Encounters:  09/09/16 145 lb 12.8 oz (66.1 kg)  09/04/16 144 lb (65.3 kg)  08/27/16 145 lb 6.4 oz (66 kg)    General appearance: alert, cooperative and appears stated age Ears: normal TM's and external ear canals both ears Throat: lips, mucosa, and tongue normal; teeth and gums normal Neck: no adenopathy, no carotid bruit, supple, symmetrical, trachea midline and thyroid not enlarged, symmetric, no tenderness/mass/nodules Back: symmetric, no curvature. ROM normal. No CVA tenderness. Lungs: clear to auscultation bilaterally Heart: regular rate and rhythm, S1, S2 normal, no murmur, click, rub or gallop Abdomen: soft, non-tender; bowel sounds normal; no masses,  no organomegaly Pulses: 2+ and symmetric Skin: Skin color, texture, turgor normal. No rashes or lesions Lymph nodes: Cervical, supraclavicular, and axillary nodes normal.  No results  found for: HGBA1C  Lab Results  Component Value Date   CREATININE 0.76 09/09/2016   CREATININE 0.71 01/30/2016   CREATININE 0.79 08/28/2015    Lab Results  Component Value Date   GLUCOSE 87 09/09/2016   CHOL 147 01/30/2016   TRIG 68.0 01/30/2016   HDL 63.20 01/30/2016   LDLDIRECT 81.0 08/28/2015   LDLCALC 70 01/30/2016   ALT 19 09/09/2016   AST 16 09/09/2016   NA 140 09/09/2016   K 4.3 09/09/2016   CL  105 09/09/2016   CREATININE 0.76 09/09/2016   BUN 13 09/09/2016   CO2 31 09/09/2016   TSH 1.48 08/28/2015    No results found.  Assessment & Plan:   Problem List Items Addressed This Visit    Hyperlipidemia    Treating aggressively due to carotid stenosis.    Lab Results  Component Value Date   CHOL 147 01/30/2016   HDL 63.20 01/30/2016   LDLCALC 70 01/30/2016   LDLDIRECT 81.0 08/28/2015   TRIG 68.0 01/30/2016   CHOLHDL 2 01/30/2016   Lab Results  Component Value Date   ALT 19 09/09/2016   AST 16 09/09/2016   ALKPHOS 91 09/09/2016   BILITOT 0.6 09/09/2016         Essential hypertension    Well controlled on current regimen. Renal function stable, no changes today.  Lab Results  Component Value Date   CREATININE 0.76 09/09/2016   Lab Results  Component Value Date   NA 140 09/09/2016   K 4.3 09/09/2016   CL 105 09/09/2016   CO2 31 09/09/2016         Abscess of buttock, left    S/p I & D by general surgery.  Wound has a good granulating bed with nonpurulent exudate. Given its proximity to anus, will try to cover with Telfa pad and a tegaderm. Clean with "wound wash" after showering        Other Visit Diagnoses    Long-term use of high-risk medication    -  Primary   Relevant Orders   Comprehensive metabolic panel (Completed)      I have discontinued Evelyn Graham's doxycycline. I am also having her maintain her aspirin, Glucosamine HCl, metoprolol succinate, atorvastatin, norethindrone-ethinyl estradiol, and loratadine.  No orders of the defined types were placed in this encounter.   Medications Discontinued During This Encounter  Medication Reason  . doxycycline (VIBRA-TABS) 100 MG tablet Therapy completed    Follow-up: Return in about 6 months (around 03/11/2017).   Crecencio Mc, MD

## 2016-09-10 DIAGNOSIS — L0231 Cutaneous abscess of buttock: Secondary | ICD-10-CM | POA: Insufficient documentation

## 2016-09-10 LAB — COMPREHENSIVE METABOLIC PANEL
ALBUMIN: 4.5 g/dL (ref 3.5–5.2)
ALK PHOS: 91 U/L (ref 39–117)
ALT: 19 U/L (ref 0–35)
AST: 16 U/L (ref 0–37)
BILIRUBIN TOTAL: 0.6 mg/dL (ref 0.2–1.2)
BUN: 13 mg/dL (ref 6–23)
CALCIUM: 9.7 mg/dL (ref 8.4–10.5)
CO2: 31 mEq/L (ref 19–32)
CREATININE: 0.76 mg/dL (ref 0.40–1.20)
Chloride: 105 mEq/L (ref 96–112)
GFR: 84.34 mL/min (ref >60.00)
Glucose, Bld: 87 mg/dL (ref 70–99)
Potassium: 4.3 mEq/L (ref 3.5–5.1)
Sodium: 140 mEq/L (ref 135–145)
TOTAL PROTEIN: 7.2 g/dL (ref 6.0–8.3)

## 2016-09-10 NOTE — Assessment & Plan Note (Signed)
Treating aggressively due to carotid stenosis.    Lab Results  Component Value Date   CHOL 147 01/30/2016   HDL 63.20 01/30/2016   LDLCALC 70 01/30/2016   LDLDIRECT 81.0 08/28/2015   TRIG 68.0 01/30/2016   CHOLHDL 2 01/30/2016   Lab Results  Component Value Date   ALT 19 09/09/2016   AST 16 09/09/2016   ALKPHOS 91 09/09/2016   BILITOT 0.6 09/09/2016

## 2016-09-10 NOTE — Assessment & Plan Note (Signed)
Well controlled on current regimen. Renal function stable, no changes today.  Lab Results  Component Value Date   CREATININE 0.76 09/09/2016   Lab Results  Component Value Date   NA 140 09/09/2016   K 4.3 09/09/2016   CL 105 09/09/2016   CO2 31 09/09/2016

## 2016-09-10 NOTE — Assessment & Plan Note (Signed)
S/p I & D by general surgery.  Wound has a good granulating bed with nonpurulent exudate. Given its proximity to anus, will try to cover with Telfa pad and a tegaderm. Clean with "wound wash" after showering

## 2016-09-11 ENCOUNTER — Telehealth: Payer: Self-pay

## 2016-09-11 MED ORDER — METOPROLOL SUCCINATE ER 50 MG PO TB24: 50.0000 mg | ORAL_TABLET | Freq: Every day | ORAL | 1 refills | Status: DC

## 2016-09-11 MED ORDER — ATORVASTATIN CALCIUM 20 MG PO TABS: 20.0000 mg | ORAL_TABLET | Freq: Every day | ORAL | 1 refills | Status: DC

## 2016-09-11 MED ORDER — NORETHINDRONE-ETH ESTRADIOL 0.5-2.5 MG-MCG PO TABS: 1.0000 | ORAL_TABLET | Freq: Every day | ORAL | 0 refills | Status: DC

## 2016-09-11 NOTE — Telephone Encounter (Signed)
Spoke with pt and informed her of her lab results. Also refilled the pt's medications.

## 2016-09-11 NOTE — Telephone Encounter (Signed)
-----   Message from Crecencio Mc, MD sent at 09/10/2016 10:48 PM EDT ----- Your liver and kidney function are normal.

## 2017-01-21 ENCOUNTER — Other Ambulatory Visit: Payer: Self-pay | Admitting: Internal Medicine

## 2017-02-11 ENCOUNTER — Other Ambulatory Visit: Payer: Self-pay | Admitting: Internal Medicine

## 2017-02-11 DIAGNOSIS — Z1231 Encounter for screening mammogram for malignant neoplasm of breast: Secondary | ICD-10-CM

## 2017-03-04 ENCOUNTER — Ambulatory Visit
Admission: RE | Admit: 2017-03-04 | Discharge: 2017-03-04 | Disposition: A | Discharge status: Home / Self Care | Payer: Federal, State, Local not specified - PPO | Source: Ambulatory Visit | Attending: Internal Medicine | Admitting: Internal Medicine

## 2017-03-04 DIAGNOSIS — Z1231 Encounter for screening mammogram for malignant neoplasm of breast: Secondary | ICD-10-CM | POA: Diagnosis present

## 2017-03-23 ENCOUNTER — Encounter: Payer: Self-pay | Admitting: Internal Medicine

## 2017-03-23 ENCOUNTER — Ambulatory Visit: Payer: Federal, State, Local not specified - PPO | Admitting: Internal Medicine

## 2017-03-23 VITALS — BP 124/78 | HR 54 | Temp 98.1°F | Resp 15 | Ht 64.0 in | Wt 151.4 lb

## 2017-03-23 DIAGNOSIS — L0231 Cutaneous abscess of buttock: Secondary | ICD-10-CM

## 2017-03-23 DIAGNOSIS — I6522 Occlusion and stenosis of left carotid artery: Secondary | ICD-10-CM

## 2017-03-23 DIAGNOSIS — I6523 Occlusion and stenosis of bilateral carotid arteries: Secondary | ICD-10-CM | POA: Diagnosis not present

## 2017-03-23 DIAGNOSIS — I10 Essential (primary) hypertension: Secondary | ICD-10-CM | POA: Diagnosis not present

## 2017-03-23 DIAGNOSIS — E7800 Pure hypercholesterolemia, unspecified: Secondary | ICD-10-CM | POA: Diagnosis not present

## 2017-03-23 LAB — CBC WITH DIFFERENTIAL/PLATELET
Basophils Absolute: 0 10*3/uL (ref 0.0–0.1)
Basophils Relative: 0.6 % (ref 0.0–3.0)
EOS PCT: 1.1 % (ref 0.0–5.0)
Eosinophils Absolute: 0.1 10*3/uL (ref 0.0–0.7)
HCT: 38.9 % (ref 36.0–46.0)
HEMOGLOBIN: 13.2 g/dL (ref 12.0–15.0)
Lymphocytes Relative: 41.8 % (ref 12.0–46.0)
Lymphs Abs: 2.8 10*3/uL (ref 0.7–4.0)
MCHC: 33.9 g/dL (ref 30.0–36.0)
MCV: 92.8 fl (ref 78.0–100.0)
MONO ABS: 0.6 10*3/uL (ref 0.1–1.0)
Monocytes Relative: 8.7 % (ref 3.0–12.0)
Neutro Abs: 3.3 10*3/uL (ref 1.4–7.7)
Neutrophils Relative %: 47.8 % (ref 43.0–77.0)
Platelets: 216 10*3/uL (ref 150.0–400.0)
RBC: 4.19 Mil/uL (ref 3.87–5.11)
RDW: 12.8 % (ref 11.5–15.5)
WBC: 6.8 10*3/uL (ref 4.0–10.5)

## 2017-03-23 LAB — COMPREHENSIVE METABOLIC PANEL
ALBUMIN: 4.6 g/dL (ref 3.5–5.2)
ALT: 21 U/L (ref 0–35)
AST: 15 U/L (ref 0–37)
Alkaline Phosphatase: 93 U/L (ref 39–117)
BUN: 10 mg/dL (ref 6–23)
CALCIUM: 9.7 mg/dL (ref 8.4–10.5)
CHLORIDE: 103 meq/L (ref 96–112)
CO2: 26 mEq/L (ref 19–32)
CREATININE: 0.74 mg/dL (ref 0.40–1.20)
GFR: 86.81 mL/min (ref >60.00)
Glucose, Bld: 94 mg/dL (ref 70–99)
POTASSIUM: 4 meq/L (ref 3.5–5.1)
Sodium: 138 mEq/L (ref 135–145)
Total Bilirubin: 0.7 mg/dL (ref 0.2–1.2)
Total Protein: 7.4 g/dL (ref 6.0–8.3)

## 2017-03-23 LAB — LIPID PANEL
CHOLESTEROL: 161 mg/dL (ref 0–200)
HDL: 64 mg/dL (ref >39.00)
LDL CALC: 80 mg/dL (ref 0–99)
NonHDL: 97.36
TRIGLYCERIDES: 88 mg/dL (ref 0.0–149.0)
Total CHOL/HDL Ratio: 3
VLDL: 17.6 mg/dL (ref 0.0–40.0)

## 2017-03-23 LAB — TSH: TSH: 2.16 u[IU]/mL (ref 0.35–4.50)

## 2017-03-23 MED ORDER — DOXYCYCLINE HYCLATE 100 MG PO TABS: 100.0000 mg | ORAL_TABLET | Freq: Two times a day (BID) | ORAL | 0 refills | Status: DC

## 2017-03-23 MED ORDER — ZOSTER VAC RECOMB ADJUVANTED 50 MCG/0.5ML IM SUSR: 0.5000 mL | Freq: Once | INTRAMUSCULAR | 1 refills | Status: AC

## 2017-03-23 NOTE — Progress Notes (Signed)
Subjective:  Patient ID: Evelyn Graham, female    DOB: May 24, 1960  Age: 57 y.o. MRN: [id number]  CC: The primary encounter diagnosis was Pure hypercholesterolemia. Diagnoses of Abscess of buttock, left, Essential hypertension, Bilateral carotid artery stenosis, and Carotid artery stenosis, asymptomatic, left were also pertinent to this visit.  HPI [employer] presents for Hypertension: patient checks blood pressure twice weekly at home.  Readings have been for the most part < 140/80 at rest . Patient is following a reduced salt diet most days and is taking medications as prescribed.  Left buttock abscess has resolved, but still drains a scant amount of clear fluid without pain or redness.  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    Outpatient Medications Prior to Visit  Medication Sig Dispense Refill  . aspirin 81 MG tablet Take 81 mg by mouth daily.    Marland Kitchen atorvastatin (LIPITOR) 20 MG tablet Take 1 tablet (20 mg total) by mouth at bedtime. 90 tablet 1  . Glucosamine HCl 1000 MG TABS Take 4,000 mg by mouth daily.    Marland Kitchen loratadine (CLARITIN) 10 MG tablet Take 10 mg by mouth daily.    . metoprolol succinate (TOPROL-XL) 50 MG 24 hr tablet Take 1 tablet (50 mg total) by mouth daily. 90 tablet 1  . norethindrone-ethinyl estradiol (FEMHRT LOW DOSE) 0.5-2.5 MG-MCG tablet TAKE 1 TABLET BY MOUTH EVERY DAY 90 tablet 0   No facility-administered medications prior to visit.     Review of Systems;  Patient denies headache, fevers, malaise, unintentional weight loss, skin rash, eye pain, sinus congestion and sinus pain, sore throat, dysphagia,  hemoptysis , cough, dyspnea, wheezing, chest pain, palpitations, orthopnea, edema, abdominal pain, nausea, melena, diarrhea, constipation, flank pain, dysuria, hematuria, urinary  Frequency, nocturia,  numbness, tingling, seizures,  Focal weakness, Loss of consciousness,  Tremor, insomnia, depression, anxiety, and suicidal ideation.      Objective:  BP 124/78 (BP Location: Left Arm, Patient Position: Sitting, Cuff Size: Normal)   Pulse (!) 54   Temp 98.1 F (36.7 C) (Oral)   Resp 15   Ht 5\' 4"  (1.626 m)   Wt 151 lb 6.4 oz (68.7 kg)   SpO2 98%   BMI 25.99 kg/m   BP Readings from Last 3 Encounters:  03/23/17 124/78  09/09/16 118/64  09/04/16 131/80    Wt Readings from Last 3 Encounters:  03/23/17 151 lb 6.4 oz (68.7 kg)  09/09/16 145 lb 12.8 oz (66.1 kg)  09/04/16 144 lb (65.3 kg)    General appearance: alert, cooperative and appears stated age Ears: normal TM's and external ear canals both ears Throat: lips, mucosa, and tongue normal; teeth and gums normal Neck: no adenopathy, no carotid bruit, supple, symmetrical, trachea midline and thyroid not enlarged, symmetric, no tenderness/mass/nodules Back: symmetric, no curvature. ROM normal. No CVA tenderness. Lungs: clear to auscultation bilaterally Heart: regular rate and rhythm, S1, S2 normal, no murmur, click, rub or gallop Abdomen: soft, non-tender; bowel sounds normal; no masses,  no organomegaly Pulses: 2+ and symmetric Skin: Skin color, texture, turgor normal. No rashes or lesions Lymph nodes: Cervical, supraclavicular, and axillary nodes normal.  No results found for: HGBA1C  Lab Results  Component Value Date   CREATININE 0.74 03/23/2017   CREATININE 0.76 09/09/2016   CREATININE 0.71 01/30/2016    Lab Results  Component Value Date  WBC 6.8 03/23/2017   HGB 13.2 03/23/2017   HCT 38.9 03/23/2017   PLT 216.0 03/23/2017   GLUCOSE 94 03/23/2017   CHOL 161 03/23/2017   TRIG 88.0 03/23/2017   HDL 64.00 03/23/2017   LDLDIRECT 81.0 08/28/2015   LDLCALC 80 03/23/2017   ALT 21 03/23/2017   AST 15 03/23/2017   NA 138 03/23/2017   K 4.0 03/23/2017   CL 103 03/23/2017   CREATININE 0.74 03/23/2017   BUN 10  03/23/2017   CO2 26 03/23/2017   TSH 2.16 03/23/2017    Mm Digital Screening Bilateral  Result Date: 03/04/2017 CLINICAL DATA:  Screening. EXAM: DIGITAL SCREENING BILATERAL MAMMOGRAM WITH CAD COMPARISON:  Previous exam(s). ACR Breast Density Category c: The breast tissue is heterogeneously dense, which may obscure small masses. FINDINGS: There are no findings suspicious for malignancy. Images were processed with CAD. IMPRESSION: No mammographic evidence of malignancy. A result letter of this screening mammogram will be mailed directly to the patient. RECOMMENDATION: Screening mammogram in one year. (Code:[id number]-B-01Y) BI-RADS CATEGORY  1: Negative. Electronically Signed   By: Claudie Revering M.D.   On: 03/04/2017 17:18    Assessment & Plan:   Problem List Items Addressed This Visit    Abscess of buttock, left    Now resolved,  Has a small blood blister .  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 Renal function stable, no changes today.  Lab Results  Component Value Date   CREATININE 0.74 03/23/2017   Lab Results  Component Value Date   NA 138 03/23/2017   K 4.0 03/23/2017   CL 103 03/23/2017   CO2 26 03/23/2017         Hyperlipidemia - Primary    Treating aggressively due to carotid stenosis.  LDL is <100.    Lab Results  Component Value Date   CHOL 161 03/23/2017   HDL 64.00 03/23/2017   LDLCALC 80 03/23/2017   LDLDIRECT 81.0 08/28/2015   TRIG 88.0 03/23/2017   CHOLHDL 3 03/23/2017   Lab Results  Component Value Date   ALT 21 03/23/2017   AST 15 03/23/2017   ALKPHOS 93 03/23/2017   BILITOT 0.7 03/23/2017          Relevant Orders   Lipid panel (Completed)   Comprehensive metabolic panel (Completed)   TSH (Completed)    Other Visit Diagnoses    Carotid artery stenosis, asymptomatic, left       Relevant Orders   Ambulatory referral to Vascular Surgery      I am having Evelyn Graham start on doxycycline and Zoster Vaccine Adjuvanted. I am also having her maintain her aspirin, Glucosamine HCl, loratadine, atorvastatin, metoprolol succinate, and norethindrone-ethinyl estradiol.  Meds ordered this encounter  Medications  . doxycycline (VIBRA-TABS) 100 MG tablet    Sig: Take 1 tablet (100 mg total) by mouth 2 (two) times daily.    Dispense:  20 tablet    Refill:  0  . Zoster Vaccine Adjuvanted [facility]) injection    Sig: Inject 0.5 mLs into the muscle once for 1 dose.    Dispense:  1 each    Refill:  1    There are no discontinued medications.  Follow-up: No Follow-up on file.   Crecencio Mc, MD

## 2017-03-23 NOTE — Patient Instructions (Signed)
Good to see you!  You have a small blood blister in the area where your abscess healed    Ice will help shrink this (if you can tolerate it)  Keep the doxycycline for any epsiode  of pain and drainage

## 2017-03-23 NOTE — Assessment & Plan Note (Addendum)
Now resolved,  Has a small blood blister .  Recommended icing it if she can tolerate it

## 2017-03-25 NOTE — Assessment & Plan Note (Signed)
Well controlled on current regimen. Renal function stable, no changes today.  Lab Results  Component Value Date   CREATININE 0.74 03/23/2017   Lab Results  Component Value Date   NA 138 03/23/2017   K 4.0 03/23/2017   CL 103 03/23/2017   CO2 26 03/23/2017

## 2017-03-25 NOTE — Assessment & Plan Note (Signed)
Treating aggressively due to carotid stenosis.  LDL is <100.    Lab Results  Component Value Date   CHOL 161 03/23/2017   HDL 64.00 03/23/2017   LDLCALC 80 03/23/2017   LDLDIRECT 81.0 08/28/2015   TRIG 88.0 03/23/2017   CHOLHDL 3 03/23/2017   Lab Results  Component Value Date   ALT 21 03/23/2017   AST 15 03/23/2017   ALKPHOS 93 03/23/2017   BILITOT 0.7 03/23/2017

## 2017-03-25 NOTE — Assessment & Plan Note (Addendum)
50-60% stenosis of the proximal left common carotid artery,  Fibromuscular dysplasia suspected  By MRA  Done in 2013

## 2017-04-15 ENCOUNTER — Other Ambulatory Visit: Payer: Self-pay | Admitting: Internal Medicine

## 2017-06-03 ENCOUNTER — Other Ambulatory Visit (INDEPENDENT_AMBULATORY_CARE_PROVIDER_SITE_OTHER): Payer: Self-pay | Admitting: Vascular Surgery

## 2017-06-03 DIAGNOSIS — I779 Disorder of arteries and arterioles, unspecified: Secondary | ICD-10-CM

## 2017-06-03 DIAGNOSIS — I739 Peripheral vascular disease, unspecified: Principal | ICD-10-CM

## 2017-06-05 ENCOUNTER — Encounter (INDEPENDENT_AMBULATORY_CARE_PROVIDER_SITE_OTHER): Payer: Self-pay | Admitting: Vascular Surgery

## 2017-06-05 ENCOUNTER — Ambulatory Visit (INDEPENDENT_AMBULATORY_CARE_PROVIDER_SITE_OTHER): Payer: Federal, State, Local not specified - PPO | Admitting: Vascular Surgery

## 2017-06-05 ENCOUNTER — Ambulatory Visit (INDEPENDENT_AMBULATORY_CARE_PROVIDER_SITE_OTHER): Payer: Federal, State, Local not specified - PPO

## 2017-06-05 VITALS — BP 136/75 | HR 60 | Resp 17 | Ht 64.0 in | Wt 154.2 lb

## 2017-06-05 DIAGNOSIS — I779 Disorder of arteries and arterioles, unspecified: Secondary | ICD-10-CM

## 2017-06-05 DIAGNOSIS — I739 Peripheral vascular disease, unspecified: Principal | ICD-10-CM

## 2017-06-05 DIAGNOSIS — I10 Essential (primary) hypertension: Secondary | ICD-10-CM

## 2017-06-05 DIAGNOSIS — I6523 Occlusion and stenosis of bilateral carotid arteries: Secondary | ICD-10-CM | POA: Diagnosis not present

## 2017-06-05 DIAGNOSIS — E7800 Pure hypercholesterolemia, unspecified: Secondary | ICD-10-CM

## 2017-06-05 NOTE — Progress Notes (Signed)
Subjective:    Patient ID: Evelyn Graham, female    DOB: 07-10-1960, 57 y.o.   MRN: [id number] Chief Complaint  Patient presents with  . New Patient (Initial Visit)    carotid stenosis   Presents as a new patient referred by Dr. Derrel Nip for evaluation of carotid artery stenosis.  Patient notes a bruit was found to the left carotid artery by her old primary care physician.  This is what led to a carotid ultrasound which was notable for intimal thickening of the distal common carotid artery and proximal internal carotid artery on the left side.  The patient presents today asymptomatically. The patient denies experiencing Amaurosis Fugax, TIA like symptoms or focal motor deficits.  The patient underwent a bilateral carotid duplex today which was notable for no evidence of thrombus, dissection or atherosclerotic plaque or stenosis to the bilateral carotid arteries.  Vertebral arteries are patent and antegrade bilaterally.  Bilateral subclavian arteries are multiphasic and within normal limits.  There is some intimal thickening at the left internal carotid artery proximally in the distal left common carotid artery.  The patient denies any fever, nausea vomiting.  Review of Systems  Constitutional: Negative.   HENT: Negative.   Eyes: Negative.   Respiratory: Negative.   Cardiovascular:       Carotid Stenosis  Gastrointestinal: Negative.   Endocrine: Negative.   Genitourinary: Negative.   Musculoskeletal: Negative.   Skin: Negative.   Allergic/Immunologic: Negative.   Neurological: Negative.   Hematological: Negative.   Psychiatric/Behavioral: Negative.       Objective:   Physical Exam  Constitutional: She is oriented to person, place, and time. She appears well-developed and well-nourished. No distress.  HENT:  Head: Normocephalic and atraumatic.  Eyes: Pupils are equal, round, and reactive to light. Conjunctivae are normal.  Neck: Normal range of motion.  No carotid bruits  noted on exam  Cardiovascular: Normal rate, regular rhythm, normal heart sounds and intact distal pulses.  Pulses:      Radial pulses are 2+ on the right side, and 2+ on the left side.  Pulmonary/Chest: Effort normal and breath sounds normal.  Musculoskeletal: Normal range of motion. She exhibits no edema.  Neurological: She is alert and oriented to person, place, and time.  Skin: Skin is warm and dry. She is not diaphoretic.  Psychiatric: She has a normal mood and affect. Her behavior is normal. Judgment and thought content normal.  Vitals reviewed.  BP 136/75 (BP Location: Right Arm)   Pulse 60   Resp 17   Ht 5\' 4"  (1.626 m)   Wt 154 lb 3.2 oz (69.9 kg)   BMI 26.47 kg/m   Past Medical History:  Diagnosis Date  . 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  . Carotid stenosis 08/30/2015  . Chicken pox 1969  . Encounter for screening colonoscopy 09/25/2015  . Essential hypertension 08/30/2015  . External hemorrhoid   . Heart murmur 2008  . 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 Hyperhydrosis disorder   . Hyperlipidemia   . Menopause syndrome 08/30/2015  . Other fatigue 08/30/2015  . Vitamin D deficiency 08/30/2015   Social History   Socioeconomic History  . Marital status: Married    Spouse name: Not on file  . Number of children: Not on file  . Years of education: Not on file  . Highest education level: Not on file  Occupational History  . Not on file  Social Needs  . Financial resource strain: Not on file  .  Food insecurity:    Worry: Not on file    Inability: Not on file  . Transportation needs:    Medical: Not on file    Non-medical: Not on file  Tobacco Use  . Smoking status: Never Smoker  . Smokeless tobacco: Never Used  Substance and Sexual Activity  . Alcohol use: Yes    Alcohol/week: 0.0 oz    Comment: 3 to 4 glasses of wine per week  . Drug use: No  . Sexual activity: Yes  Lifestyle  . Physical activity:    Days per week: Not on file    Minutes per  session: Not on file  . Stress: Not on file  Relationships  . Social connections:    Talks on phone: Not on file    Gets together: Not on file    Attends religious service: Not on file    Active member of club or organization: Not on file    Attends meetings of clubs or organizations: Not on file    Relationship status: Not on file  . Intimate partner violence:    Fear of current or ex partner: Not on file    Emotionally abused: Not on file    Physically abused: Not on file    Forced sexual activity: Not on file  Other Topics Concern  . Not on file  Social History Narrative  . Not on file   Past Surgical History:  Procedure Laterality Date  . COLONOSCOPY WITH PROPOFOL N/A 11/21/2015   Procedure: COLONOSCOPY WITH PROPOFOL;  Surgeon: [name], MD;  [facility] ENDOSCOPY;  Service: Endoscopy;  Laterality: N/A;  . GANGLION CYST EXCISION Left 2007   Family History  Problem Relation Age of Onset  . Hyperlipidemia Mother   . Paget's disease of bone Mother   . Heart disease Father 33       early forties  . Hyperlipidemia Father   . Hypertension Father   . Cancer Maternal Uncle 60       colon  . Heart disease Maternal Grandmother   . Heart disease Maternal Grandfather   . Cancer Paternal Grandmother        stomach cancer?  . Cancer Paternal Grandfather        stomach cancer?   Allergies  Allergen Reactions  . Sulfa Antibiotics Anaphylaxis      Assessment & Plan:  Presents as a new patient referred by Dr. Derrel Nip for evaluation of carotid artery stenosis.  Patient notes a bruit was found to the left carotid artery by her old primary care physician.  This is what led to a carotid ultrasound which was notable for intimal thickening of the distal common carotid artery and proximal internal carotid artery on the left side.  The patient presents today asymptomatically. The patient denies experiencing Amaurosis Fugax, TIA like symptoms or focal motor deficits.  The patient  underwent a bilateral carotid duplex today which was notable for no evidence of thrombus, dissection or atherosclerotic plaque or stenosis to the bilateral carotid arteries.  Vertebral arteries are patent and antegrade bilaterally.  Bilateral subclavian arteries are multiphasic and within normal limits.  There is some intimal thickening at the left internal carotid artery proximally in the distal left common carotid artery.  The patient denies any fever, nausea vomiting.  1. Bilateral carotid artery stenosis - New Studies reviewed with patient. No intervention at this time.  Patient to return in six months for surveillance carotid duplex.  If the patient's duplex is  stable we can move her follow-up out further Patient to remain abstinent of tobacco use. I have discussed with the patient at length the risk factors for and pathogenesis of atherosclerotic disease and encouraged a healthy diet, regular exercise regimen and blood pressure / glucose control.  Patient was instructed to contact our office in the interim with problems such as arm / leg weakness or numbness, speech / swallowing difficulty or temporary monocular blindness. The patient expresses their understanding.   - VAS US CAROTID; Future  2. Essential hypertension - Stable Encouraged good control as its slows the progression of atherosclerotic disease  3. Pure hypercholesterolemia - Stable Encouraged good control as its slows the progression of atherosclerotic disease  Current Outpatient Medications on File Prior to Visit  Medication Sig Dispense Refill  . aspirin 81 MG tablet Take 81 mg by mouth daily.    Marland Kitchen atorvastatin (LIPITOR) 20 MG tablet Take 1 tablet (20 mg total) by mouth at bedtime. 90 tablet 1  . Glucosamine HCl 1000 MG TABS Take 4,000 mg by mouth daily.    Marland Kitchen loratadine (CLARITIN) 10 MG tablet Take 10 mg by mouth daily.    . metoprolol succinate (TOPROL-XL) 50 MG 24 hr tablet Take 1 tablet (50 mg total) by mouth daily. 90  tablet 1  . norethindrone-ethinyl estradiol (FEMHRT LOW DOSE) 0.5-2.5 MG-MCG tablet TAKE 1 TABLET BY MOUTH EVERY DAY 90 tablet 1  . doxycycline (VIBRA-TABS) 100 MG tablet Take 1 tablet (100 mg total) by mouth 2 (two) times daily. (Patient not taking: Reported on 06/05/2017) 20 tablet 0   No current facility-administered medications on file prior to visit.    There are no Patient Instructions on file for this visit. No follow-ups on file.  KIMBERLY A STEGMAYER, PA-C

## 2017-07-13 ENCOUNTER — Other Ambulatory Visit: Payer: Self-pay | Admitting: Internal Medicine

## 2017-09-23 ENCOUNTER — Other Ambulatory Visit: Payer: Self-pay | Admitting: Internal Medicine

## 2017-10-08 ENCOUNTER — Encounter: Payer: Self-pay | Admitting: *Deleted

## 2017-10-08 ENCOUNTER — Ambulatory Visit
Admission: EM | Admit: 2017-10-08 | Discharge: 2017-10-08 | Disposition: A | Discharge status: Home / Self Care | Payer: Federal, State, Local not specified - PPO | Attending: Family Medicine | Admitting: Family Medicine

## 2017-10-08 DIAGNOSIS — H1132 Conjunctival hemorrhage, left eye: Secondary | ICD-10-CM | POA: Diagnosis not present

## 2017-10-08 MED ORDER — POLYMYXIN B-TRIMETHOPRIM 10000-0.1 UNIT/ML-% OP SOLN: 1.0000 [drp] | Freq: Four times a day (QID) | OPHTHALMIC | 0 refills | Status: AC

## 2017-10-08 NOTE — ED Triage Notes (Signed)
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 Since this she has had worsening pain, redness and vision changes. Sclera of left eye is now red and painful.

## 2017-10-08 NOTE — ED Provider Notes (Signed)
[facility]    CSN: [id number] Arrival date & time: 10/08/17  1010  History   Chief Complaint Chief Complaint  Patient presents with  . Eye Injury   HPI  57 year old female presents with right eye irritation.  Patient states that she was at work yesterday and felt a sensation that something got into her left eye.  She is unsure if she actually had a foreign body in her eye.  She states that she has had mild pain and redness of the lateral aspect of her eye since then.  She states she has had some occasional floaters.  No drainage.  She does note watering.  She is had some visual disturbance as well.  No photophobia.  Pain is mild.  No known exacerbating factors.  She is used some eyedrops without resolution.  No other complaints or concerns at this time.  Past Medical History:  Diagnosis Date  . 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  . Carotid stenosis 08/30/2015  . Chicken pox 1969  . Encounter for screening colonoscopy 09/25/2015  . Essential hypertension 08/30/2015  . External hemorrhoid   . Heart murmur 2008  . 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 Hyperhydrosis disorder   . Hyperlipidemia   . Menopause syndrome 08/30/2015  . Other fatigue 08/30/2015  . Vitamin D deficiency 08/30/2015    Patient Active Problem List   Diagnosis Date Noted  . Abscess of buttock, left 09/10/2016  . Encounter for screening colonoscopy 09/25/2015  . Vitamin D deficiency 08/30/2015  . Hyperlipidemia 08/30/2015  . Other fatigue 08/30/2015  . 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  . Essential hypertension 08/30/2015  . Carotid stenosis 08/30/2015  . Menopause syndrome 08/30/2015    Past Surgical History:  Procedure Laterality Date  . COLONOSCOPY WITH PROPOFOL N/A 11/21/2015   Procedure: COLONOSCOPY WITH PROPOFOL;  Surgeon: [name], MD;  Location: [facility] ENDOSCOPY;  Service: Endoscopy;  Laterality: N/A;  . GANGLION CYST  EXCISION Left 2007    OB History    Gravida  1   Para  0   Term      Preterm      AB  1   Living        SAB  1   TAB      Ectopic      Multiple      Live Births           Obstetric Comments  1st Menstrual Cycle:  16  1st Pregnancy:  25          Home Medications    Prior to Admission medications   Medication Sig Start Date End Date Taking? Authorizing Provider  aspirin 81 MG tablet Take 81 mg by mouth daily.   Yes [provider]  atorvastatin (LIPITOR) 20 MG tablet TAKE 1 TABLET BY MOUTH EVERYDAY AT BEDTIME 07/13/17  Yes Crecencio Mc, MD  doxycycline (VIBRA-TABS) 100 MG tablet Take 1 tablet (100 mg total) by mouth 2 (two) times daily. 03/23/17  Yes Crecencio Mc, MD  Glucosamine HCl 1000 MG TABS Take 4,000 mg by mouth daily.   Yes [provider]  loratadine (CLARITIN) 10 MG tablet Take 10 mg by mouth daily.   Yes [provider]  metoprolol succinate (TOPROL-XL) 50 MG 24 hr tablet TAKE 1 TABLET BY MOUTH EVERY DAY 07/13/17  Yes Crecencio Mc, MD  norethindrone-ethinyl estradiol (FEMHRT LOW DOSE) 0.5-2.5 MG-MCG tablet TAKE 1 TABLET  BY MOUTH EVERY DAY 09/23/17  Yes Crecencio Mc, MD  trimethoprim-polymyxin b (POLYTRIM) ophthalmic solution Place 1 drop into the left eye every 6 (six) hours for 5 days. 10/08/17 10/13/17  Coral Spikes, DO    Family History Family History  Problem Relation Age of Onset  . Hyperlipidemia Mother   . Paget's disease of bone Mother   . Heart disease Father 109       early forties  . Hyperlipidemia Father   . Hypertension Father   . Cancer Maternal Uncle 60       colon  . Heart disease Maternal Grandmother   . Heart disease Maternal Grandfather   . Cancer Paternal Grandmother        stomach cancer?  . Cancer Paternal Grandfather        stomach cancer?    Social History Social History   Tobacco Use  . Smoking status: Never Smoker  . Smokeless tobacco: Never Used  Substance Use Topics  .  Alcohol use: Yes    Alcohol/week: 0.0 oz    Comment: 3 to 4 glasses of wine per week  . Drug use: No     Allergies   Sulfa antibiotics   Review of Systems Review of Systems  Constitutional: Negative.   Eyes: Positive for pain and redness. Negative for discharge and visual disturbance.   Physical Exam Triage Vital Signs ED Triage Vitals  Enc Vitals Group     BP 10/08/17 1025 (!) 139/106     Pulse Rate 10/08/17 1025 65     Resp 10/08/17 1025 16     Temp 10/08/17 1025 98.5 F (36.9 C)     Temp Source 10/08/17 1025 Oral     SpO2 10/08/17 1025 100 %     Weight 10/08/17 1028 145 lb (65.8 kg)     Height 10/08/17 1028 5\' 4"  (1.626 m)     Head Circumference --      Peak Flow --      Pain Score 10/08/17 1027 0     Pain Loc --      Pain Edu? --      Excl. in [place]? --    No data found.  Updated Vital Signs BP (!) 139/106 (BP Location: Left Arm)   Pulse 65   Temp 98.5 F (36.9 C) (Oral)   Resp 16   Ht 5\' 4"  (1.626 m)   Wt 145 lb (65.8 kg)   SpO2 100%   BMI 24.89 kg/m   Visual Acuity Right Eye Distance: 20/30 Left Eye Distance: 20/50 Bilateral Distance: 20/30  Right Eye Near:   Left Eye Near:    Bilateral Near:     Physical Exam  Constitutional: She is oriented to person, place, and time. She appears well-developed. No distress.  HENT:  Head: Normocephalic and atraumatic.  Eyes:    Area of redness of the left eye.  Appears to be consistent with except conjunctival hemorrhage.  She had a small area of fluorescein uptake at the lower portion of the hemorrhage.  No appreciable foreign body.  Pulmonary/Chest: Effort normal. No respiratory distress.  Neurological: She is alert and oriented to person, place, and time.  Psychiatric: She has a normal mood and affect. Her behavior is normal.  Nursing note and vitals reviewed.  UC Treatments / Results  Labs (all labs ordered are listed, but only abnormal results are displayed) Labs Reviewed - No data to  display  EKG None  Radiology No results found.  Procedures Procedures (including critical care time)  Medications Ordered in UC Medications - No data to display  Initial Impression / Assessment and Plan / UC Course  I have reviewed the triage vital signs and the nursing notes.  Pertinent labs & imaging results that were available during my care of the patient were reviewed by me and considered in my medical decision making (see chart for details).    57 year old female presents with what appears to be a subconjunctival hemorrhage.  Given a small area of fluorescein uptake, she was placed on antibiotic eyedrops. Advised her to see [facility] if she fails to improve or worsens.  Final Clinical Impressions(s) / UC Diagnoses   Final diagnoses:  Subconjunctival hemorrhage of left eye     Discharge Instructions     If you have any trouble contact [facility].  Take care  Dr. Lacinda Axon    ED Prescriptions    Medication Sig Dispense Auth. Provider   trimethoprim-polymyxin b (POLYTRIM) ophthalmic solution Place 1 drop into the left eye every 6 (six) hours for 5 days. 10 mL Coral Spikes, DO     Controlled Substance Prescriptions  Controlled Substance Registry consulted? Not Applicable   Coral Spikes, DO 10/08/17 1121

## 2017-10-08 NOTE — Discharge Instructions (Signed)
If you have any trouble contact [facility].  Take care  Dr. Lacinda Axon

## 2017-12-08 ENCOUNTER — Encounter (INDEPENDENT_AMBULATORY_CARE_PROVIDER_SITE_OTHER): Payer: Self-pay | Admitting: Vascular Surgery

## 2017-12-08 ENCOUNTER — Ambulatory Visit (INDEPENDENT_AMBULATORY_CARE_PROVIDER_SITE_OTHER): Payer: Federal, State, Local not specified - PPO

## 2017-12-08 ENCOUNTER — Ambulatory Visit (INDEPENDENT_AMBULATORY_CARE_PROVIDER_SITE_OTHER): Payer: Federal, State, Local not specified - PPO | Admitting: Vascular Surgery

## 2017-12-08 VITALS — BP 144/78 | HR 63 | Resp 16 | Ht 64.0 in | Wt 152.0 lb

## 2017-12-08 DIAGNOSIS — I6523 Occlusion and stenosis of bilateral carotid arteries: Secondary | ICD-10-CM

## 2017-12-08 DIAGNOSIS — E7800 Pure hypercholesterolemia, unspecified: Secondary | ICD-10-CM

## 2017-12-08 DIAGNOSIS — I10 Essential (primary) hypertension: Secondary | ICD-10-CM | POA: Diagnosis not present

## 2017-12-08 NOTE — Assessment & Plan Note (Signed)
Her carotid duplex shows only some intimal thickening bilaterally without hemodynamically significant carotid stenosis in either carotid artery She is on aspirin and a statin agent.  This is a mild finding and would require infrequent evaluation may be every 2 to 3 years.  She will contact our office with any changes or problems in the interim.

## 2017-12-08 NOTE — Assessment & Plan Note (Signed)
lipid control important in reducing the progression of atherosclerotic disease. Continue statin therapy  

## 2017-12-08 NOTE — Assessment & Plan Note (Signed)
blood pressure control important in reducing the progression of atherosclerotic disease. On appropriate oral medications.  

## 2017-12-08 NOTE — Progress Notes (Signed)
MRN : [id number]  Evelyn Graham is a 57 y.o. ([birth year]) female who presents with chief complaint of  Chief Complaint  Patient presents with  . Carotid    26month follow up  .  History of Present Illness: Patient returns in follow-up of carotid disease.  She was noted to have a bruit by her primary care physician and had a study earlier this year demonstrating some intimal thickening without hemodynamically significant stenosis of her carotid arteries.  She has no current focal neurologic symptoms. Specifically, the patient denies amaurosis fugax, speech or swallowing difficulties, or arm or leg weakness or numbness.  Her carotid duplex shows only some intimal thickening bilaterally without hemodynamically significant carotid stenosis in either carotid artery  Current Outpatient Medications  Medication Sig Dispense Refill  . aspirin 81 MG tablet Take 81 mg by mouth daily.    Marland Kitchen atorvastatin (LIPITOR) 20 MG tablet TAKE 1 TABLET BY MOUTH EVERYDAY AT BEDTIME 90 tablet 1  . Glucosamine HCl 1000 MG TABS Take 4,000 mg by mouth daily.    Marland Kitchen loratadine (CLARITIN) 10 MG tablet Take 10 mg by mouth daily.    . metoprolol succinate (TOPROL-XL) 50 MG 24 hr tablet TAKE 1 TABLET BY MOUTH EVERY DAY 90 tablet 1  . norethindrone-ethinyl estradiol (FEMHRT LOW DOSE) 0.5-2.5 MG-MCG tablet TAKE 1 TABLET BY MOUTH EVERY DAY 84 tablet 2  . doxycycline (VIBRA-TABS) 100 MG tablet Take 1 tablet (100 mg total) by mouth 2 (two) times daily. (Patient not taking: Reported on 12/08/2017) 20 tablet 0   No current facility-administered medications for this visit.     Past Medical History:  Diagnosis Date  . 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  . Carotid stenosis 08/30/2015  . Chicken pox 1969  . Encounter for screening colonoscopy 09/25/2015  . Essential hypertension 08/30/2015  . External hemorrhoid   . Heart murmur 2008  . 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 Hyperhydrosis disorder   . Hyperlipidemia    . Menopause syndrome 08/30/2015  . Other fatigue 08/30/2015  . Vitamin D deficiency 08/30/2015    Past Surgical History:  Procedure Laterality Date  . COLONOSCOPY WITH PROPOFOL N/A 11/21/2015   Procedure: COLONOSCOPY WITH PROPOFOL;  Surgeon: [name], MD;  Location[facility] ENDOSCOPY;  Service: Endoscopy;  Laterality: N/A;  . GANGLION CYST EXCISION Left 2007    Social History Social History   Tobacco Use  . Smoking status: Never Smoker  . Smokeless tobacco: Never Used  Substance Use Topics  . Alcohol use: Yes    Alcohol/week: 0.0 standard drinks    Comment: 3 to 4 glasses of wine per week  . Drug use: No     Family History Family History  Problem Relation Age of Onset  . Hyperlipidemia Mother   . Paget's disease of bone Mother   . Heart disease Father 55       early forties  . Hyperlipidemia Father   . Hypertension Father   . Cancer Maternal Uncle 60       colon  . Heart disease Maternal Grandmother   . Heart disease Maternal Grandfather   . Cancer Paternal Grandmother        stomach cancer?  . Cancer Paternal Grandfather        stomach cancer?     Allergies  Allergen Reactions  . Sulfa Antibiotics Anaphylaxis     REVIEW OF SYSTEMS (Negative unless checked)  Constitutional: [] Weight loss  [] Fever  [] Chills Cardiac: [] Chest pain   []   Chest pressure   [] Palpitations   [] Shortness of breath when laying flat   [] Shortness of breath at rest   [] Shortness of breath with exertion. Vascular:  [] Pain in legs with walking   [] Pain in legs at rest   [] Pain in legs when laying flat   [] Claudication   [] Pain in feet when walking  [] Pain in feet at rest  [] Pain in feet when laying flat   [] History of DVT   [] Phlebitis   [] Swelling in legs   [] Varicose veins   [] Non-healing ulcers Pulmonary:   [] Uses home oxygen   [] Productive cough   [] Hemoptysis   [] Wheeze  [] COPD   [] Asthma Neurologic:  [] Dizziness  [] Blackouts   [] Seizures   [] History of stroke   [] History of TIA   [] Aphasia   [] Temporary blindness   [] Dysphagia   [] Weakness or numbness in arms   [] Weakness or numbness in legs Musculoskeletal:  [] Arthritis   [] Joint swelling   [] Joint pain   [] Low back pain Hematologic:  [] Easy bruising  [] Easy bleeding   [] Hypercoagulable state   [] Anemic  [] Hepatitis Gastrointestinal:  [] Blood in stool   [] Vomiting blood  [] Gastroesophageal reflux/heartburn   [] Difficulty swallowing. Genitourinary:  [] Chronic kidney disease   [] Difficult urination  [] Frequent urination  [] Burning with urination   [] Blood in urine Skin:  [] Rashes   [] Ulcers   [] Wounds Psychological:  [] History of anxiety   []  History of major depression.  Physical Examination  Vitals:   12/08/17 1459 12/08/17 1500  BP: (!) 145/83 (!) 144/78  Pulse: 63   Resp: 16   Weight: 152 lb (68.9 kg)   Height: 5\' 4"  (1.626 m)    Body mass index is 26.09 kg/m. Gen:  WD/WN, NAD Head: [place]/AT, No temporalis wasting. Ear/Nose/Throat: Hearing grossly intact, nares w/o erythema or drainage, trachea midline Eyes: Conjunctiva clear. Sclera non-icteric Neck: Supple.  Soft right carotid bruit  Pulmonary:  Good air movement, equal and clear to auscultation bilaterally.  Cardiac: RRR, No JVD Vascular:  Vessel Right Left  Radial Palpable Palpable                                    Musculoskeletal: M/S 5/5 throughout.  No deformity or atrophy. No edema. Neurologic: CN 2-12 intact. Sensation grossly intact in extremities.  Symmetrical.  Speech is fluent. Motor exam as listed above. Psychiatric: Judgment intact, Mood & affect appropriate for pt's clinical situation. Dermatologic: No rashes or ulcers noted.  No cellulitis or open wounds.      CBC Lab Results  Component Value Date   WBC 6.8 03/23/2017   HGB 13.2 03/23/2017   HCT 38.9 03/23/2017   MCV 92.8 03/23/2017   PLT 216.0 03/23/2017    BMET    Component Value Date/Time   NA 138 03/23/2017 1442   K 4.0 03/23/2017 1442   CL 103 03/23/2017  1442   CO2 26 03/23/2017 1442   GLUCOSE 94 03/23/2017 1442   BUN 10 03/23/2017 1442   CREATININE 0.74 03/23/2017 1442   CALCIUM 9.7 03/23/2017 1442   CrCl cannot be calculated (Patient's most recent lab result is older than the maximum 21 days allowed.).  COAG No results found for: INR, PROTIME  Radiology No results found.   Assessment/Plan Hyperlipidemia lipid control important in reducing the progression of atherosclerotic disease. Continue statin therapy   Essential hypertension blood pressure control important in reducing the progression of atherosclerotic disease.  On appropriate oral medications.   Carotid stenosis Her carotid duplex shows only some intimal thickening bilaterally without hemodynamically significant carotid stenosis in either carotid artery She is on aspirin and a statin agent.  This is a mild finding and would require infrequent evaluation may be every 2 to 3 years.  She will contact our office with any changes or problems in the interim.     Leotis Pain, MD  12/08/2017 3:19 PM    This note was created with Dragon medical transcription system.  Any errors from dictation are purely unintentionalf

## 2018-01-06 ENCOUNTER — Other Ambulatory Visit: Payer: Self-pay | Admitting: Internal Medicine

## 2018-03-24 ENCOUNTER — Other Ambulatory Visit: Payer: Self-pay | Admitting: Internal Medicine

## 2018-03-24 DIAGNOSIS — Z1231 Encounter for screening mammogram for malignant neoplasm of breast: Secondary | ICD-10-CM

## 2018-04-08 ENCOUNTER — Ambulatory Visit
Admission: RE | Admit: 2018-04-08 | Discharge: 2018-04-08 | Disposition: A | Discharge status: Home / Self Care | Payer: Federal, State, Local not specified - PPO | Source: Ambulatory Visit | Attending: Internal Medicine | Admitting: Internal Medicine

## 2018-04-08 DIAGNOSIS — Z1231 Encounter for screening mammogram for malignant neoplasm of breast: Secondary | ICD-10-CM

## 2018-04-13 ENCOUNTER — Other Ambulatory Visit: Payer: Self-pay | Admitting: Internal Medicine

## 2018-06-01 ENCOUNTER — Telehealth: Payer: Self-pay | Admitting: Internal Medicine

## 2018-06-21 NOTE — Telephone Encounter (Signed)
Pt called stating she is due for annual visit but wanting to wait to schedule due to [place]. Pt asking for refills for a couple of months to get thru this situation.  norethindrone-ethinyl estradiol (FEMHRT LOW DOSE) 0.5-2.5 MG-MCG tablet   [id number] - [place], [place] - [address] [phone] (Phone) [phone] (Fax)

## 2018-06-21 NOTE — Telephone Encounter (Signed)
YES OK TO FILL

## 2018-06-21 NOTE — Telephone Encounter (Signed)
PE scheduled in June due to Covid.

## 2018-06-21 NOTE — Telephone Encounter (Signed)
Is it okay to refill till pt's physical in June?

## 2018-06-22 MED ORDER — NORETHINDRONE-ETH ESTRADIOL 0.5-2.5 MG-MCG PO TABS: 1.0000 | ORAL_TABLET | Freq: Every day | ORAL | 2 refills | Status: DC

## 2018-06-22 NOTE — Addendum Note (Signed)
Addended by: Adair Laundry on: 06/22/2018 09:24 AM   Modules accepted: Orders

## 2018-06-22 NOTE — Telephone Encounter (Signed)
medication has been refilled. 

## 2018-07-09 ENCOUNTER — Other Ambulatory Visit: Payer: Self-pay | Admitting: Internal Medicine

## 2018-07-09 NOTE — Telephone Encounter (Signed)
Pt has a appointment in June with you and she is requesting a refill of atorvastatin, please advise.  Elad Macphail,cma

## 2018-08-24 ENCOUNTER — Ambulatory Visit: Payer: Federal, State, Local not specified - PPO | Admitting: Internal Medicine

## 2018-08-25 ENCOUNTER — Other Ambulatory Visit: Payer: Self-pay

## 2018-08-26 ENCOUNTER — Other Ambulatory Visit (HOSPITAL_COMMUNITY)
Admission: RE | Admit: 2018-08-26 | Discharge: 2018-08-26 | Disposition: A | Discharge status: Home / Self Care | Payer: Federal, State, Local not specified - PPO | Source: Ambulatory Visit | Attending: Internal Medicine | Admitting: Internal Medicine

## 2018-08-26 ENCOUNTER — Encounter: Payer: Self-pay | Admitting: Internal Medicine

## 2018-08-26 ENCOUNTER — Ambulatory Visit (INDEPENDENT_AMBULATORY_CARE_PROVIDER_SITE_OTHER): Payer: Federal, State, Local not specified - PPO | Admitting: Internal Medicine

## 2018-08-26 VITALS — BP 142/90 | HR 65 | Temp 98.4°F | Resp 14 | Ht 64.0 in | Wt 151.4 lb

## 2018-08-26 DIAGNOSIS — Z124 Encounter for screening for malignant neoplasm of cervix: Secondary | ICD-10-CM

## 2018-08-26 DIAGNOSIS — I10 Essential (primary) hypertension: Secondary | ICD-10-CM

## 2018-08-26 DIAGNOSIS — R5383 Other fatigue: Secondary | ICD-10-CM

## 2018-08-26 DIAGNOSIS — E7800 Pure hypercholesterolemia, unspecified: Secondary | ICD-10-CM

## 2018-08-26 DIAGNOSIS — Z0000 Encounter for general adult medical examination without abnormal findings: Secondary | ICD-10-CM | POA: Diagnosis not present

## 2018-08-26 DIAGNOSIS — R61 Generalized hyperhidrosis: Secondary | ICD-10-CM

## 2018-08-26 NOTE — Progress Notes (Signed)
Patient ID: Evelyn Graham, female    DOB: October 26, 1960  Age: 58 y.o. MRN: [id number]  The patient is here for preventive examination and management of other chronic and acute problems.   The risk factors are reflected in the social history.  The roster of all physicians providing medical care to patient - is listed in the Snapshot section of the chart.  Activities of daily living:  The patient is 100% independent in all ADLs: dressing, toileting, feeding as well as independent mobility  Home safety : The patient has smoke detectors in the home. They wear seatbelts.  There are no firearms at home. There is no violence in the home.   There is no risks for hepatitis, STDs or HIV. There is no   history of blood transfusion. They have no travel history to infectious disease endemic areas of the world.  The patient has seen their dentist in the last six month. They have seen their eye doctor in the last year. They deny hearing difficulty .  They do not  have excessive sun exposure. Discussed the need for sun protection: hats, long sleeves and use of sunscreen if there is significant sun exposure.   Diet: the importance of a healthy diet is discussed. They do have a healthy diet.  The benefits of regular aerobic exercise were discussed. She walks 4 times per week ,  20 minutes.   Depression screen: there are no signs or vegative symptoms of depression- irritability, change in appetite, anhedonia, sadness/tearfullness.  The following portions of the patient's history were reviewed and updated as appropriate: allergies, current medications, past family history, past medical history,  past surgical history, past social history  and problem list.  Visual acuity was not assessed per patient preference since she has regular follow up with her ophthalmologist. Hearing and body mass index were assessed and reviewed.   During the course of the visit the patient was educated and counseled about  appropriate screening and preventive services including : fall prevention , diabetes screening, nutrition counseling, colorectal cancer screening, and recommended immunizations.    CC: The primary encounter diagnosis was Screening for cervical cancer. Diagnoses of Pure hypercholesterolemia, Essential hypertension, Other fatigue, Hyperhidrosis, and Encounter for preventive health examination were also pertinent to this visit.  1) Hypertension: patient checks blood pressure twice weekly at home.  Readings have been for the most part ,140/80  at rest . Patient is following a reduce salt diet most days and is taking medications as prescribed  History Evelyn Graham has a past medical history of 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 (08/30/2015), Carotid stenosis (08/30/2015), Chicken pox (1969), Encounter for screening colonoscopy (09/25/2015), Essential hypertension (08/30/2015), External hemorrhoid, Heart murmur (2008), Hyperhidrosis (08/30/2015), Hyperhydrosis disorder, Hyperlipidemia, Menopause syndrome (08/30/2015), Other fatigue (08/30/2015), and Vitamin D deficiency (08/30/2015).   She has a past surgical history that includes Ganglion cyst excision (Left, 2007) and Colonoscopy with propofol (N/A, 11/21/2015).   Her family history includes Cancer in her paternal grandfather and paternal grandmother; Cancer (age of onset: 50) in her maternal uncle; Heart disease in her maternal grandfather and maternal grandmother; Heart disease (age of onset: 109) in her father; Hyperlipidemia in her father and mother; Hypertension in her father; Paget's disease of bone in her mother.She reports that she has never smoked. She has never used smokeless tobacco. She reports current alcohol use. She reports that she does not use drugs.  Outpatient Medications Prior to Visit  Medication Sig Dispense Refill  . aspirin 81  MG tablet Take 81 mg by mouth daily.    Marland Kitchen atorvastatin (LIPITOR) 20 MG tablet TAKE 1 TABLET BY MOUTH EVERYDAY AT  BEDTIME 90 tablet 0  . Glucosamine HCl 1000 MG TABS Take 4,000 mg by mouth daily.    Marland Kitchen loratadine (CLARITIN) 10 MG tablet Take 10 mg by mouth daily.    . metoprolol succinate (TOPROL-XL) 50 MG 24 hr tablet TAKE 1 TABLET BY MOUTH EVERY DAY 90 tablet 0  . norethindrone-ethinyl estradiol (FEMHRT LOW DOSE) 0.5-2.5 MG-MCG tablet Take 1 tablet by mouth daily. 84 tablet 2  . doxycycline (VIBRA-TABS) 100 MG tablet Take 1 tablet (100 mg total) by mouth 2 (two) times daily. (Patient not taking: Reported on 12/08/2017) 20 tablet 0   No facility-administered medications prior to visit.     Review of Systems   Patient denies headache, fevers, malaise, unintentional weight loss, skin rash, eye pain, sinus congestion and sinus pain, sore throat, dysphagia,  hemoptysis , cough, dyspnea, wheezing, chest pain, palpitations, orthopnea, edema, abdominal pain, nausea, melena, diarrhea, constipation, flank pain, dysuria, hematuria, urinary  Frequency, nocturia, numbness, tingling, seizures,  Focal weakness, Loss of consciousness,  Tremor, insomnia, depression, anxiety, and suicidal ideation.      Objective:  BP (!) 142/90 (BP Location: Left Arm, Patient Position: Sitting, Cuff Size: Normal)   Pulse 65   Temp 98.4 F (36.9 C) (Oral)   Resp 14   Ht 5\' 4"  (1.626 m)   Wt 151 lb 6.4 oz (68.7 kg)   SpO2 99%   BMI 25.99 kg/m   Physical Exam   General Appearance:    Alert, cooperative, no distress, appears stated age  Head:    Normocephalic, without obvious abnormality, atraumatic  Eyes:    PERRL, conjunctiva/corneas clear, EOM's intact, fundi    benign, both eyes  Ears:    Normal TM's and external ear canals, both ears  Nose:   Nares normal, septum midline, mucosa normal, no drainage    or sinus tenderness  Throat:   Lips, mucosa, and tongue normal; teeth and gums normal  Neck:   Supple, symmetrical, trachea midline, no adenopathy;    thyroid:  no enlargement/tenderness/nodules; no carotid   bruit or JVD   Back:     Symmetric, no curvature, ROM normal, no CVA tenderness  Lungs:     Clear to auscultation bilaterally, respirations unlabored  Chest Wall:    No tenderness or deformity   Heart:    Regular rate and rhythm, S1 and S2 normal, no murmur, rub   or gallop  Breast Exam:    No tenderness, masses, or nipple abnormality  Abdomen:     Soft, non-tender, bowel sounds active all four quadrants,    no masses, no organomegaly  Genitalia:    Pelvic: cervix normal in appearance, external genitalia normal, no adnexal masses or tenderness, no cervical motion tenderness, rectovaginal septum normal, uterus normal size, shape, and consistency and vagina normal without discharge  Extremities:   Extremities normal, atraumatic, no cyanosis or edema  Pulses:   2+ and symmetric all extremities  Skin:   Skin color, texture, turgor normal, no rashes or lesions  Lymph nodes:   Cervical, supraclavicular, and axillary nodes normal  Neurologic:   CNII-XII intact, normal strength, sensation and reflexes    throughout      Assessment & Plan:   Problem List Items Addressed This Visit    Other fatigue   Relevant Orders   CBC with Differential/Platelet   TSH  Hyperlipidemia    Treating aggressively with atorvastatin due to carotid stenosis with goal LDL  <100.   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 Referral to Dr Phillip Heal, no ongoing therapy per patient preference       Essential hypertension    Well controlled on metoprolol XL 50 mg daily .  Renal function normal, no changes  today.  Lab Results  Component Value Date   CREATININE 0.74 03/23/2017   Lab Results  Component Value Date   NA 138 03/23/2017   K 4.0 03/23/2017   CL 103 03/23/2017   CO2 26 03/23/2017         Relevant Orders   Comprehensive metabolic panel   Encounter for preventive health examination    age appropriate education and counseling updated, referrals for preventative services and immunizations addressed, dietary and smoking counseling addressed, most recent labs reviewed.  I have personally reviewed and have noted:  1) the patient's medical and social history 2) The pt's use of alcohol, tobacco, and illicit drugs 3) The patient's current medications and supplements 4) Functional ability including ADL's, fall risk, home safety risk, hearing and visual impairment 5) Diet and physical activities 6) Evidence for depression or mood disorder 7) The patient's height, weight, and BMI have been recorded in the chart  I have made referrals, and provided counseling and education based on review of the above       Other Visit Diagnoses    Screening for cervical cancer    -  Primary   Relevant Orders   Cytology - PAP( [facility])      I have discontinued Christi S. Mountjoy's doxycycline. I am also having her maintain her aspirin, Glucosamine HCl, loratadine, norethindrone-ethinyl estradiol, atorvastatin, and metoprolol succinate.  No orders of the defined types were placed in this encounter.   Medications Discontinued During This Encounter  Medication Reason  . doxycycline (VIBRA-TABS) 100 MG tablet Error    Follow-up: No follow-ups on file.   Crecencio Mc, MD

## 2018-08-26 NOTE — Patient Instructions (Signed)
Good to see you!  Please schedule a fasting lab appointment   Continue surveillance of blood pressures and let me know if they stay above 140/80    Health Maintenance for Postmenopausal Women Menopause is a normal process in which your reproductive ability comes to an end. This process happens gradually over a span of months to years, usually between the ages of 97 and 68. Menopause is complete when you have missed 12 consecutive menstrual periods. It is important to talk with your health care provider about some of the most common conditions that affect postmenopausal women, such as heart disease, cancer, and bone loss (osteoporosis). Adopting a healthy lifestyle and getting preventive care can help to promote your health and wellness. Those actions can also lower your chances of developing some of these common conditions. What should I know about menopause? During menopause, you may experience a number of symptoms, such as:  Moderate-to-severe hot flashes.  Night sweats.  Decrease in sex drive.  Mood swings.  Headaches.  Tiredness.  Irritability.  Memory problems.  Insomnia. Choosing to treat or not to treat menopausal changes is an individual decision that you make with your health care provider. What should I know about hormone replacement therapy and supplements? Hormone therapy products are effective for treating symptoms that are associated with menopause, such as hot flashes and night sweats. Hormone replacement carries certain risks, especially as you become older. If you are thinking about using estrogen or estrogen with progestin treatments, discuss the benefits and risks with your health care provider. What should I know about heart disease and stroke? Heart disease, heart attack, and stroke become more likely as you age. This may be due, in part, to the hormonal changes that your body experiences during menopause. These can affect how your body processes dietary fats,  triglycerides, and cholesterol. Heart attack and stroke are both medical emergencies. There are many things that you can do to help prevent heart disease and stroke:  Have your blood pressure checked at least every 1-2 years. High blood pressure causes heart disease and increases the risk of stroke.  If you are 53-64 years old, ask your health care provider if you should take aspirin to prevent a heart attack or a stroke.  Do not use any tobacco products, including cigarettes, chewing tobacco, or electronic cigarettes. If you need help quitting, ask your health care provider.  It is important to eat a healthy diet and maintain a healthy weight. ? Be sure to include plenty of vegetables, fruits, low-fat dairy products, and lean protein. ? Avoid eating foods that are high in solid fats, added sugars, or salt (sodium).  Get regular exercise. This is one of the most important things that you can do for your health. ? Try to exercise for at least 150 minutes each week. The type of exercise that you do should increase your heart rate and make you sweat. This is known as moderate-intensity exercise. ? Try to do strengthening exercises at least twice each week. Do these in addition to the moderate-intensity exercise.  Know your numbers.Ask your health care provider to check your cholesterol and your blood glucose. Continue to have your blood tested as directed by your health care provider.  What should I know about cancer screening? There are several types of cancer. Take the following steps to reduce your risk and to catch any cancer development as early as possible. Breast Cancer  Practice breast self-awareness. ? This means understanding how your breasts normally  appear and feel. ? It also means doing regular breast self-exams. Let your health care provider know about any changes, no matter how small.  If you are 22 or older, have a clinician do a breast exam (clinical breast exam or CBE)  every year. Depending on your age, family history, and medical history, it may be recommended that you also have a yearly breast X-ray (mammogram).  If you have a family history of breast cancer, talk with your health care provider about genetic screening.  If you are at high risk for breast cancer, talk with your health care provider about having an MRI and a mammogram every year.  Breast cancer (BRCA) gene test is recommended for women who have family members with BRCA-related cancers. Results of the assessment will determine the need for genetic counseling and BRCA1 and for BRCA2 testing. BRCA-related cancers include these types: ? Breast. This occurs in males or females. ? Ovarian. ? Tubal. This may also be called fallopian tube cancer. ? Cancer of the abdominal or pelvic lining (peritoneal cancer). ? Prostate. ? Pancreatic. Cervical, Uterine, and Ovarian Cancer Your health care provider may recommend that you be screened regularly for cancer of the pelvic organs. These include your ovaries, uterus, and vagina. This screening involves a pelvic exam, which includes checking for microscopic changes to the surface of your cervix (Pap test).  For women ages 21-65, health care providers may recommend a pelvic exam and a Pap test every three years. For women ages 42-65, they may recommend the Pap test and pelvic exam, combined with testing for human papilloma virus (HPV), every five years. Some types of HPV increase your risk of cervical cancer. Testing for HPV may also be done on women of any age who have unclear Pap test results.  Other health care providers may not recommend any screening for nonpregnant women who are considered low risk for pelvic cancer and have no symptoms. Ask your health care provider if a screening pelvic exam is right for you.  If you have had past treatment for cervical cancer or a condition that could lead to cancer, you need Pap tests and screening for cancer for at  least 20 years after your treatment. If Pap tests have been discontinued for you, your risk factors (such as having a new sexual partner) need to be reassessed to determine if you should start having screenings again. Some women have medical problems that increase the chance of getting cervical cancer. In these cases, your health care provider may recommend that you have screening and Pap tests more often.  If you have a family history of uterine cancer or ovarian cancer, talk with your health care provider about genetic screening.  If you have vaginal bleeding after reaching menopause, tell your health care provider.  There are currently no reliable tests available to screen for ovarian cancer. Lung Cancer Lung cancer screening is recommended for adults 57-71 years old who are at high risk for lung cancer because of a history of smoking. A yearly low-dose CT scan of the lungs is recommended if you:  Currently smoke.  Have a history of at least 30 pack-years of smoking and you currently smoke or have quit within the past 15 years. A pack-year is smoking an average of one pack of cigarettes per day for one year. Yearly screening should:  Continue until it has been 15 years since you quit.  Stop if you develop a health problem that would prevent you from having lung  cancer treatment. Colorectal Cancer  This type of cancer can be detected and can often be prevented.  Routine colorectal cancer screening usually begins at age 43 and continues through age 9.  If you have risk factors for colon cancer, your health care provider may recommend that you be screened at an earlier age.  If you have a family history of colorectal cancer, talk with your health care provider about genetic screening.  Your health care provider may also recommend using home test kits to check for hidden blood in your stool.  A small camera at the end of a tube can be used to examine your colon directly (sigmoidoscopy  or colonoscopy). This is done to check for the earliest forms of colorectal cancer.  Direct examination of the colon should be repeated every 5-10 years until age 9. However, if early forms of precancerous polyps or small growths are found or if you have a family history or genetic risk for colorectal cancer, you may need to be screened more often. Skin Cancer  Check your skin from head to toe regularly.  Monitor any moles. Be sure to tell your health care provider: ? About any new moles or changes in moles, especially if there is a change in a mole's shape or color. ? If you have a mole that is larger than the size of a pencil eraser.  If any of your family members has a history of skin cancer, especially at a young age, talk with your health care provider about genetic screening.  Always use sunscreen. Apply sunscreen liberally and repeatedly throughout the day.  Whenever you are outside, protect yourself by wearing long sleeves, pants, a wide-brimmed hat, and sunglasses. What should I know about osteoporosis? Osteoporosis is a condition in which bone destruction happens more quickly than new bone creation. After menopause, you may be at an increased risk for osteoporosis. To help prevent osteoporosis or the bone fractures that can happen because of osteoporosis, the following is recommended:  If you are 4-64 years old, get at least 1,000 mg of calcium and at least 600 mg of vitamin D per day.  If you are older than age 15 but younger than age 73, get at least 1,200 mg of calcium and at least 600 mg of vitamin D per day.  If you are older than age 34, get at least 1,200 mg of calcium and at least 800 mg of vitamin D per day. Smoking and excessive alcohol intake increase the risk of osteoporosis. Eat foods that are rich in calcium and vitamin D, and do weight-bearing exercises several times each week as directed by your health care provider. What should I know about how menopause affects  my mental health? Depression may occur at any age, but it is more common as you become older. Common symptoms of depression include:  Low or sad mood.  Changes in sleep patterns.  Changes in appetite or eating patterns.  Feeling an overall lack of motivation or enjoyment of activities that you previously enjoyed.  Frequent crying spells. Talk with your health care provider if you think that you are experiencing depression. What should I know about immunizations? It is important that you get and maintain your immunizations. These include:  Tetanus, diphtheria, and pertussis (Tdap) booster vaccine.  Influenza every year before the flu season begins.  Pneumonia vaccine.  Shingles vaccine. Your health care provider may also recommend other immunizations. This information is not intended to replace advice given to you by  your health care provider. Make sure you discuss any questions you have with your health care provider. Document Released: 04/25/2005 Document Revised: 09/21/2015 Document Reviewed: 12/05/2014 Elsevier Interactive Patient Education  2019 [employer].

## 2018-08-28 DIAGNOSIS — Z0000 Encounter for general adult medical examination without abnormal findings: Secondary | ICD-10-CM | POA: Insufficient documentation

## 2018-08-28 NOTE — Assessment & Plan Note (Signed)
Treating aggressively with atorvastatin due to carotid stenosis with goal LDL  <100.   Labs are due   Lab Results  Component Value Date   CHOL 161 03/23/2017   HDL 64.00 03/23/2017   LDLCALC 80 03/23/2017   LDLDIRECT 81.0 08/28/2015   TRIG 88.0 03/23/2017   CHOLHDL 3 03/23/2017   Lab Results  Component Value Date   ALT 21 03/23/2017   AST 15 03/23/2017   ALKPHOS 93 03/23/2017   BILITOT 0.7 03/23/2017

## 2018-08-28 NOTE — Assessment & Plan Note (Signed)
Well controlled on metoprolol XL 50 mg daily .  Renal function normal, no changes today.  Lab Results  Component Value Date   CREATININE 0.74 03/23/2017   Lab Results  Component Value Date   NA 138 03/23/2017   K 4.0 03/23/2017   CL 103 03/23/2017   CO2 26 03/23/2017

## 2018-08-28 NOTE — Assessment & Plan Note (Signed)

## 2018-08-28 NOTE — Assessment & Plan Note (Signed)
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 Referral to Dr Phillip Heal, no ongoing therapy per patient preference

## 2018-08-31 LAB — CYTOLOGY - PAP
Diagnosis: NEGATIVE
HPV: NOT DETECTED

## 2018-09-03 ENCOUNTER — Encounter: Payer: Self-pay | Admitting: *Deleted

## 2018-09-24 ENCOUNTER — Other Ambulatory Visit (INDEPENDENT_AMBULATORY_CARE_PROVIDER_SITE_OTHER): Payer: Federal, State, Local not specified - PPO

## 2018-09-24 ENCOUNTER — Other Ambulatory Visit: Payer: Self-pay

## 2018-09-24 DIAGNOSIS — E7800 Pure hypercholesterolemia, unspecified: Secondary | ICD-10-CM

## 2018-09-24 DIAGNOSIS — I10 Essential (primary) hypertension: Secondary | ICD-10-CM | POA: Diagnosis not present

## 2018-09-24 DIAGNOSIS — R5383 Other fatigue: Secondary | ICD-10-CM | POA: Diagnosis not present

## 2018-09-24 LAB — LIPID PANEL
Cholesterol: 143 mg/dL (ref 0–200)
HDL: 56.4 mg/dL (ref >39.00)
LDL Cholesterol: 69 mg/dL (ref 0–99)
NonHDL: 86.63
Total CHOL/HDL Ratio: 3
Triglycerides: 87 mg/dL (ref 0.0–149.0)
VLDL: 17.4 mg/dL (ref 0.0–40.0)

## 2018-09-24 LAB — COMPREHENSIVE METABOLIC PANEL
ALT: 21 U/L (ref 0–35)
AST: 15 U/L (ref 0–37)
Albumin: 4.4 g/dL (ref 3.5–5.2)
Alkaline Phosphatase: 87 U/L (ref 39–117)
BUN: 11 mg/dL (ref 6–23)
CO2: 25 mEq/L (ref 19–32)
Calcium: 9 mg/dL (ref 8.4–10.5)
Chloride: 108 mEq/L (ref 96–112)
Creatinine, Ser: 0.85 mg/dL (ref 0.40–1.20)
GFR: 69.22 mL/min (ref >60.00)
Glucose, Bld: 97 mg/dL (ref 70–99)
Potassium: 4.4 mEq/L (ref 3.5–5.1)
Sodium: 141 mEq/L (ref 135–145)
Total Bilirubin: 1 mg/dL (ref 0.2–1.2)
Total Protein: 6.5 g/dL (ref 6.0–8.3)

## 2018-09-24 LAB — CBC WITH DIFFERENTIAL/PLATELET
Basophils Absolute: 0.1 10*3/uL (ref 0.0–0.1)
Basophils Relative: 1.1 % (ref 0.0–3.0)
Eosinophils Absolute: 0.1 10*3/uL (ref 0.0–0.7)
Eosinophils Relative: 1.4 % (ref 0.0–5.0)
HCT: 37.5 % (ref 36.0–46.0)
Hemoglobin: 12.8 g/dL (ref 12.0–15.0)
Lymphocytes Relative: 35.8 % (ref 12.0–46.0)
Lymphs Abs: 1.6 10*3/uL (ref 0.7–4.0)
MCHC: 34.1 g/dL (ref 30.0–36.0)
MCV: 92.6 fl (ref 78.0–100.0)
Monocytes Absolute: 0.4 10*3/uL (ref 0.1–1.0)
Monocytes Relative: 8.4 % (ref 3.0–12.0)
Neutro Abs: 2.4 10*3/uL (ref 1.4–7.7)
Neutrophils Relative %: 53.3 % (ref 43.0–77.0)
Platelets: 181 10*3/uL (ref 150.0–400.0)
RBC: 4.05 Mil/uL (ref 3.87–5.11)
RDW: 12.8 % (ref 11.5–15.5)
WBC: 4.6 10*3/uL (ref 4.0–10.5)

## 2018-09-24 LAB — TSH: TSH: 1.86 u[IU]/mL (ref 0.35–4.50)

## 2018-09-27 ENCOUNTER — Other Ambulatory Visit: Payer: Self-pay

## 2018-09-27 MED ORDER — ATORVASTATIN CALCIUM 20 MG PO TABS: ORAL_TABLET | ORAL | 3 refills | Status: DC

## 2018-09-27 MED ORDER — METOPROLOL SUCCINATE ER 50 MG PO TB24: 50.0000 mg | ORAL_TABLET | Freq: Every day | ORAL | 3 refills | Status: DC

## 2019-01-10 ENCOUNTER — Other Ambulatory Visit: Payer: Self-pay

## 2019-01-10 DIAGNOSIS — Z20822 Contact with and (suspected) exposure to covid-19: Secondary | ICD-10-CM

## 2019-01-11 LAB — NOVEL CORONAVIRUS, NAA: SARS-CoV-2, NAA: NOT DETECTED

## 2019-01-12 ENCOUNTER — Telehealth: Payer: Self-pay | Admitting: Internal Medicine

## 2019-01-12 NOTE — Telephone Encounter (Signed)
Negative COVID results given. Patient results "NOT Detected." Caller expressed understanding. ° °

## 2019-02-03 ENCOUNTER — Other Ambulatory Visit: Payer: Self-pay

## 2019-02-03 DIAGNOSIS — Z20822 Contact with and (suspected) exposure to covid-19: Secondary | ICD-10-CM

## 2019-02-05 LAB — NOVEL CORONAVIRUS, NAA: SARS-CoV-2, NAA: NOT DETECTED

## 2019-03-04 ENCOUNTER — Other Ambulatory Visit: Payer: Self-pay | Admitting: Internal Medicine

## 2019-06-07 ENCOUNTER — Telehealth: Payer: Self-pay | Admitting: Internal Medicine

## 2019-06-07 ENCOUNTER — Other Ambulatory Visit: Payer: Self-pay | Admitting: Internal Medicine

## 2019-06-07 DIAGNOSIS — Z1231 Encounter for screening mammogram for malignant neoplasm of breast: Secondary | ICD-10-CM

## 2019-06-07 NOTE — Telephone Encounter (Signed)
Mammogram ordered. Pt aware.

## 2019-06-07 NOTE — Telephone Encounter (Signed)
Pt said she called [facility] to schedule mammogram and they told her they need an order sent to them. Plt said she doesn't know if she is due or not but she received a letter telling her to schedule.

## 2019-06-14 IMAGING — MG MM DIGITAL SCREENING BILAT W/ CAD
5 series · 5 of 5 positions shown · non-contrast
Comparison: Previous exam(s).

CLINICAL DATA: Screening.

EXAM:
DIGITAL SCREENING BILATERAL MAMMOGRAM WITH CAD

[L MLO (1 of 2)]
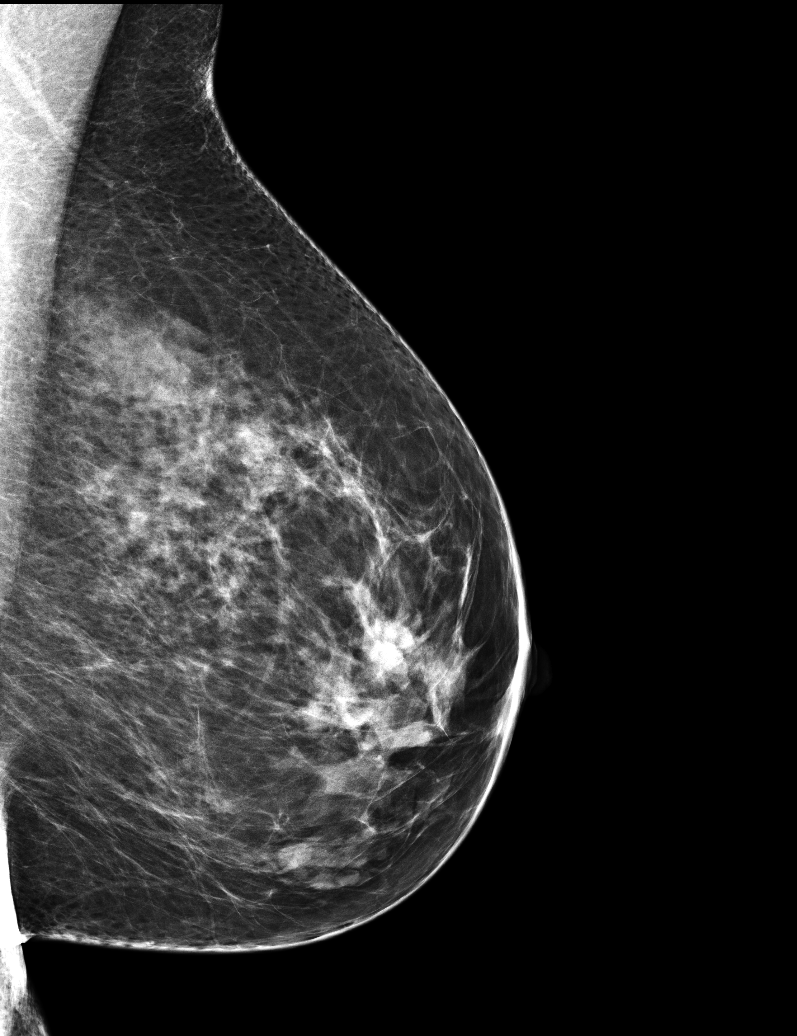

[R CC]
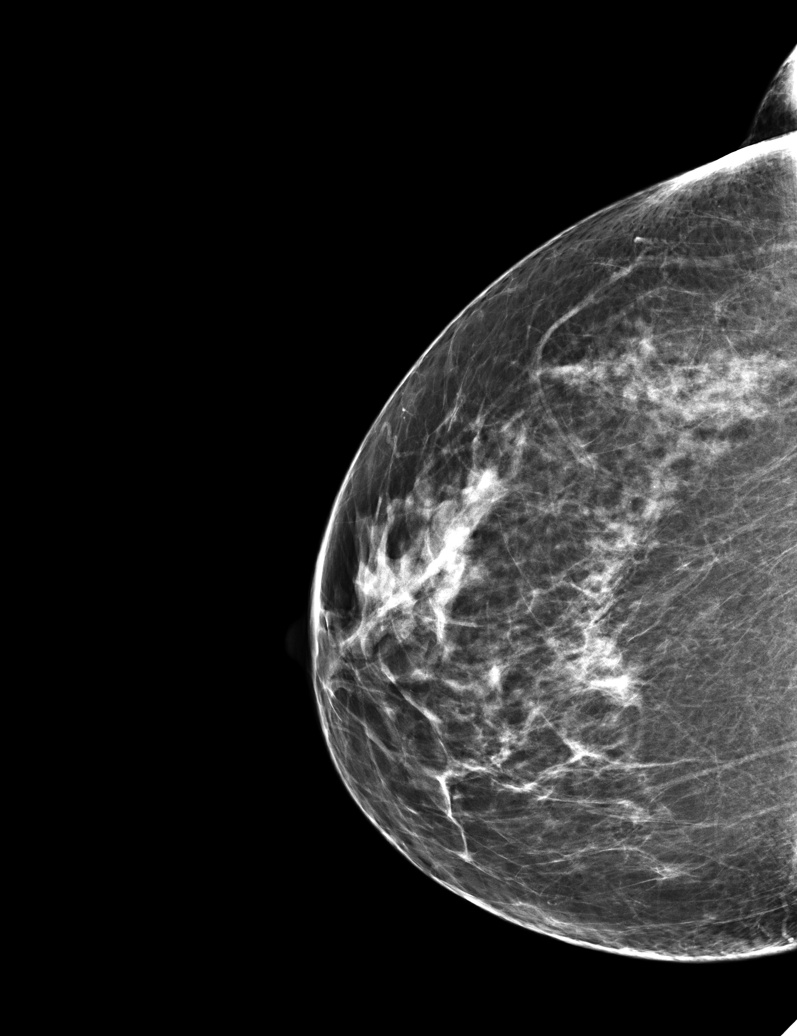

[R MLO]
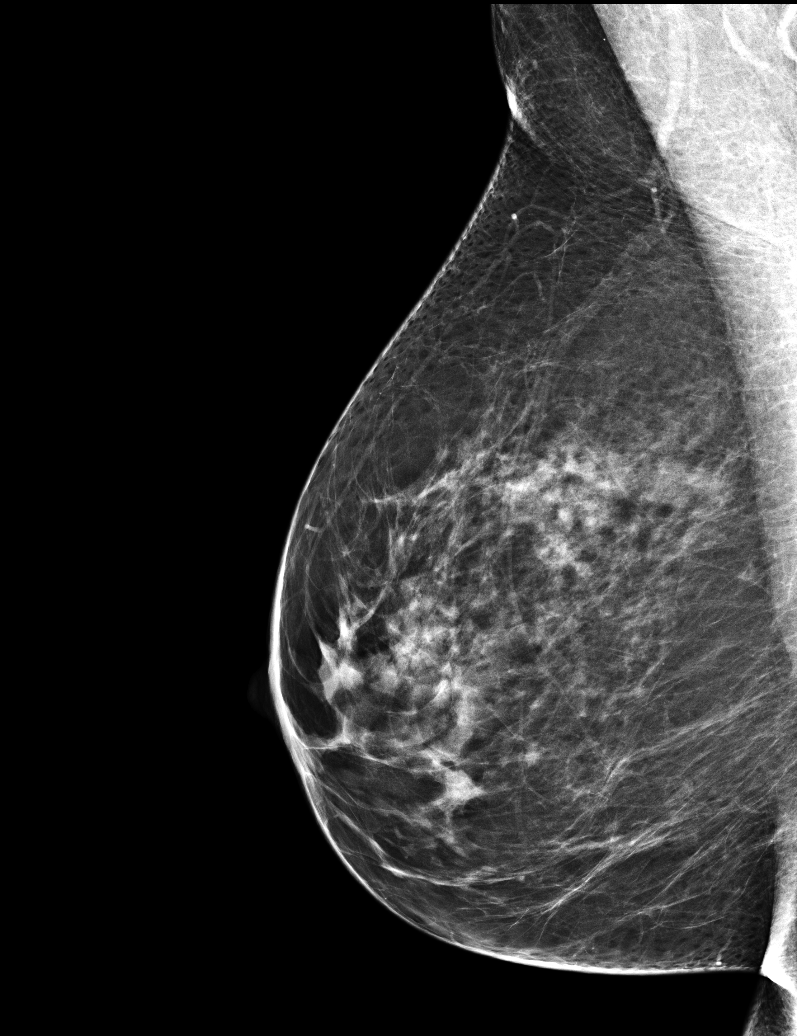

[L CC]
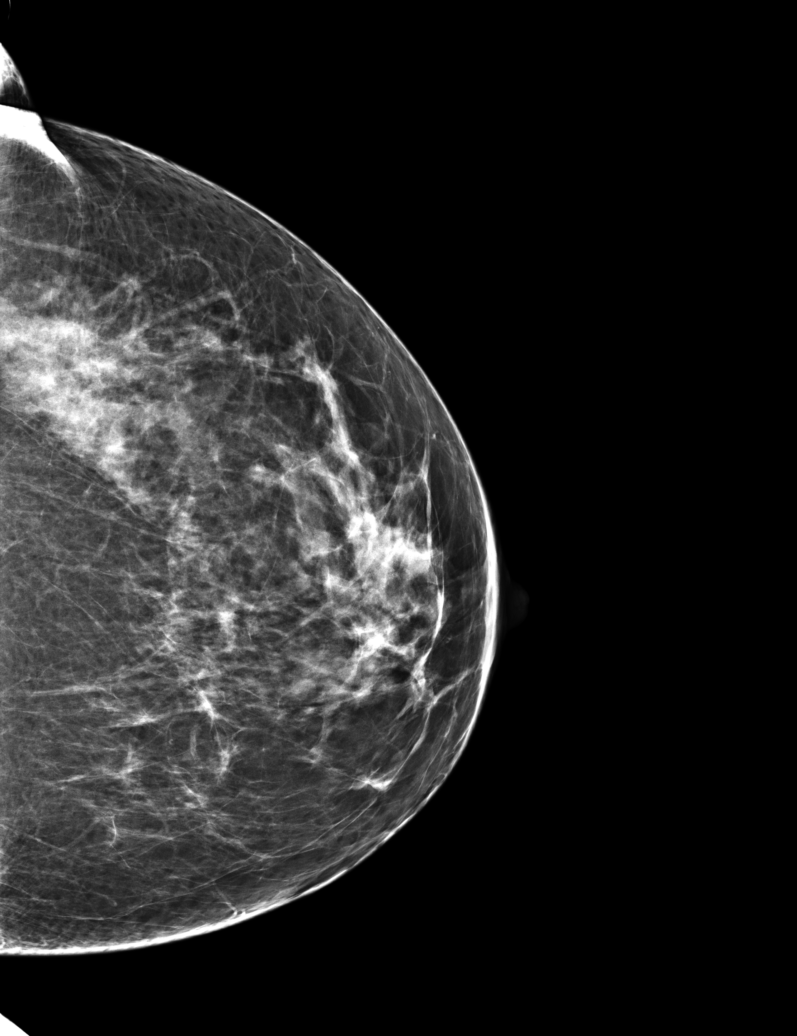

[L MLO (2 of 2)]
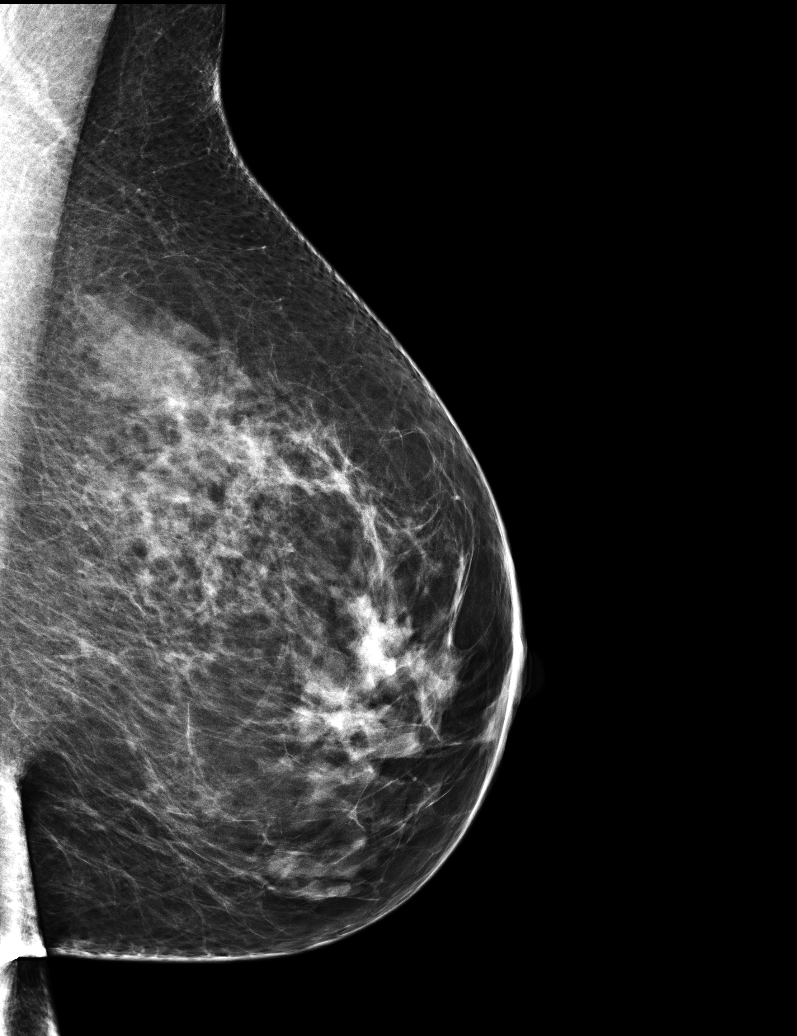

[5 of 5 positions shown; findings below may reference images not displayed]

ACR Breast Density Category c: The breast tissue is heterogeneously
dense, which may obscure small masses.
FINDINGS: There are no findings suspicious for malignancy. Images were
processed with CAD.
IMPRESSION: No mammographic evidence of malignancy. A result letter of this
screening mammogram will be mailed directly to the patient.

RECOMMENDATION:
Screening mammogram in one year. (Code:[id number])

BI-RADS CATEGORY  1: Negative.

## 2019-06-17 ENCOUNTER — Ambulatory Visit
Admission: RE | Admit: 2019-06-17 | Discharge: 2019-06-17 | Disposition: A | Discharge status: Home / Self Care | Payer: Federal, State, Local not specified - PPO | Source: Ambulatory Visit | Attending: Internal Medicine | Admitting: Internal Medicine

## 2019-06-17 DIAGNOSIS — Z1231 Encounter for screening mammogram for malignant neoplasm of breast: Secondary | ICD-10-CM | POA: Diagnosis present

## 2019-09-10 ENCOUNTER — Other Ambulatory Visit: Payer: Self-pay | Admitting: Internal Medicine

## 2019-11-09 ENCOUNTER — Other Ambulatory Visit: Payer: Self-pay | Admitting: Internal Medicine

## 2020-06-18 NOTE — Progress Notes (Signed)
[facility]  [address] [place], [place] [address]     [name]    Date of Visit:  01/24/2015  Date of Birth: [birth year]  Age: 60 yrs.   Medical Record Number: [id number]  __  CURRENT DIAGNOSES     1. Pure hypercholesterolemia, E78.0   2. Essential (primary) hypertension, I10  3. 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 Chest pain, unspecified, R07.9  5. Family history of ischemic heart disease and other diseases of the circulatory system, Z82.49   6. Nonrheumatic mitral (valve) prolapse, I34.1  __  ALLERGIES    Sulfa (Sulfonamides), Anaphylaxis   __  MEDICATIONS     1. Aspirin 81 mg Tablet, Chewable, 1 p.o. q.d.  2. Zyrtec 10 mg Tablet, 1 p.o. q.d.  3. Botox 100 unit injection, 2  q year  4. Glucosamine 500 mg tablet, 1 po q d  5. norethindrone acetate-ethinyl estradiol 0.5 mg-2.5 mcg tablet, 1 po q d  6. Toprol XL 50 mg tablet,extended release, 1 po qd  7. Lipitor 20 mg tablet, 1 po qhs  __   CHIEF COMPLAINT/REASON FOR VISIT  Followup of Essential (primary) hypertension, Followup of Family history of ischemic heart disease and other diseases of the circulatory system, Followup of Nonrheumatic  mitral (valve) prolapse and Followup of Occlusion and stenosis of bilateral carotid arteries  __  HISTORY OF PRESENT ILLNESS  [name] is a 60 year old  female who returns to the office today for follow-up. She has been feeling well from a cardiac standpoint and feels that her palpitations are occurring less frequently. She denies any chest pain, palpitations, shortness of breath, syncopal episodes, or  symptoms of congestive heart failure. Her blood pressure is 136/72 in the office today and 148/78 on repeat. She monitors her pressure at home and reports an average of 115/60 to 135/70. She has had some elevated blood pressures due to high stress at  work but she is quitting her job in December.     A lipid panel drawn on January 18, 2015 showed triglycerides of 61, HDL  of 69, LDL of 66, and total cholesterol of 147 mg/dL on Lipitor 20 mg daily.     __  PAST HISTORY      Past Medical Illnesses: Hyperlipidemia, Hypertension;  Past Cardiac Illnesses : history of Mitral valve prolapse; Infectious Diseases: Chicken Pox, Measles; Surgical Procedures : Removal of a ganglion cyst 01/2006.; Cardiology Procedures-Noninvasive: Echocardiogram-10/26/00;Holter Monitor-11/01/00;Treadmill Stress Test-06/22/06;Echocardiogram-06/15/06,  Stress Echocardiogram March 2012; Left Ventricular Ejection Fraction: LVEF of 65% documented via echocardiogram on 06/15/2006;  Peripheral Vascular Procedures: Carotid NIVA August 2013  ___  FAMILY HISTORY   Mother -- Heart disease    __  CARDIAC RISK FACTORS     Tobacco Abuse: negative;  Family History of Heart Disease: positive; Hyperlipidemia: borderline;  Hypertension: positive;  Diabetes Mellitus: negative;  Prior History of Heart Disease: positive; Obesity: negative;  Sedentary Life Style:negative; [id number]  __   SOCIAL HISTORY    Alcohol Use: socially, Beer, wine and ixed drinsk 2-3 a week.;  Smoking: Does not smoke; Never smoker ([id number]); Diet: Regular diet;  Exercise: Exercises occasionally;   __  PHYSICAL EXAMINATION    Vital Signs:   Blood Pressure:  136/72 Sitting, Right arm, regular cuff  132/72 Sitting, Left arm, regular cuff    Weight:  139.00 lbs.  Height: 64"  BMI: 24    Pulse: 70/min.       Constitutional: Cooperative, alert and oriented,well  developed, well  nourished, in no acute distress. Skin: Warm and dry to touch, no apparent skin lesions, or masses noted.  Head: Normocephalic, normal hair pattern, no masses or tenderness Eyes: EOMS Intact, PERRL, conjunctivae  and lids unremarkable. Funduscopic exam and visual fields not performed. ENT: Ears, Nose and throat reveal no gross abnormalities. No pallor or cyanosis.  Dentition good. Neck: No palpable masses or adenopathy, no thyromegaly, no JVD, carotid pulses are full and equal bilaterally  without bruits.  Chest: Normal symmetry, no tenderness to palpation, normal respiratory excursion, no intercostal retraction, no use of accessory muscles, normal diaphragmatic excursion, clear to auscultation and percussion.  Cardiac: Regular rhythm, S1 normal, S2 normal, no S3 present, systolic click present, grade 1/6 systolic murmur heard best at the apex Abdomen : Abdomen soft, bowel sounds normoactive, no masses, no hepatosplenomegaly, non-tender, no bruits Peripheral Pulses: The femoral, popliteal, dorsalis  pedis, and posterior tibial pulses are full and equal bilaterally with no bruits auscultated. Extremities/Back: No deformities, clubbing, cyanosis, erythema  or edema observed. There are no spinal abnormalities noted. Normal muscle strength and tone. Neurological: No gross motor or sensory deficits noted,  affect appropriate, oriented to time, person and place.   __    Medications added today by the physician:  Lipitor 20 mg tablet, 1 po qhs,  90  Toprol XL 50 mg tablet,extended release, 1 po qd, 90    ECG: Sinus rhythm, 70 bpm, within normal limits.     IMPRESSIONS:  1. Hypertension, borderline elevated in the office today.  2. Hyperlipidemia, on atorvastatin 20 mg  with a well-controlled LDL.   3. Strong family history of ischemic heart disease. Ms. Perret' father died in his 10s with a  history of multiple myocardial infarctions.   4. Normal stress echocardiogram, April 2012.  5. Carotid ultrasound  (September 2014), less than 50% stenosis bilaterally and luminal   irregularity, consider fibromuscular dysplasia.  a. 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  6. Occasional  palpitations.     RECOMMENDATIONS:   1. Increase Toprol to 50 mg daily.   2. Monitor blood pressure at home.   3. She will be moving to [place] soon and will establish follow-up there.     Documented by Nelida Gores,  acting as a scribe for Dr. Julienne Kass, MD, [facility].    I, Dr. Julienne Kass, personally performed the services described in the documentation, as scribed by Nelida Gores in my presence, and it is both accurate and correct.     Julienne Kass, MD, [facility]

## 2020-07-20 ENCOUNTER — Other Ambulatory Visit: Payer: Self-pay | Admitting: Internal Medicine

## 2020-09-25 ENCOUNTER — Other Ambulatory Visit: Payer: Self-pay | Admitting: Internal Medicine

## 2020-11-27 ENCOUNTER — Ambulatory Visit
Admission: RE | Admit: 2020-11-27 | Discharge: 2020-11-27 | Disposition: A | Discharge status: Home / Self Care | Payer: Federal, State, Local not specified - PPO | Source: Ambulatory Visit | Attending: Emergency Medicine | Admitting: Emergency Medicine

## 2020-11-27 ENCOUNTER — Other Ambulatory Visit: Payer: Self-pay

## 2020-11-27 VITALS — BP 174/76 | HR 89 | Temp 100.0°F | Resp 20

## 2020-11-27 DIAGNOSIS — R509 Fever, unspecified: Secondary | ICD-10-CM | POA: Insufficient documentation

## 2020-11-27 DIAGNOSIS — N390 Urinary tract infection, site not specified: Secondary | ICD-10-CM | POA: Diagnosis present

## 2020-11-27 DIAGNOSIS — M545 Low back pain, unspecified: Secondary | ICD-10-CM | POA: Insufficient documentation

## 2020-11-27 LAB — POCT URINALYSIS DIP (MANUAL ENTRY)
Bilirubin, UA: NEGATIVE
Blood, UA: NEGATIVE
Glucose, UA: NEGATIVE mg/dL
Ketones, POC UA: NEGATIVE mg/dL
Nitrite, UA: NEGATIVE
Protein Ur, POC: NEGATIVE mg/dL
Spec Grav, UA: 1.015 (ref 1.010–1.025)
Urobilinogen, UA: 0.2 E.U./dL
pH, UA: 5.5 (ref 5.0–8.0)

## 2020-11-27 MED ORDER — IBUPROFEN 600 MG PO TABS: 600.0000 mg | ORAL_TABLET | Freq: Four times a day (QID) | ORAL | 0 refills | Status: DC | PRN

## 2020-11-27 MED ORDER — CEPHALEXIN 500 MG PO CAPS: 500.0000 mg | ORAL_CAPSULE | Freq: Four times a day (QID) | ORAL | 0 refills | Status: AC

## 2020-11-27 MED ORDER — CEFTRIAXONE SODIUM 1 G IJ SOLR
1.0000 g | Freq: Once | INTRAMUSCULAR | Status: AC
  Administered 2020-11-27: 1 g via INTRAMUSCULAR

## 2020-11-27 NOTE — Discharge Instructions (Addendum)
I have given you a gram of Rocephin to cover an early pyelonephritis.  Continue pushing plenty of fluids.  Take 600 mg of ibuprofen combined with 1000 mg Tylenol together 3-4 times a day as needed for pain.  Finish the Keflex unless your urine culture comes back negative for urinary tract infection, in which case, you can discontinue the Keflex.  Follow-up with your doctor in several days if you are not getting any better.  Go to the ER if you get worse

## 2020-11-27 NOTE — ED Triage Notes (Signed)
Patient complains of frequent urination.  Pain in left lower back noticed over the weekend.  Patient feeling feverish, overall not feeling well.  Patient did have covid one month ago.

## 2020-11-27 NOTE — ED Provider Notes (Signed)
HPI  SUBJECTIVE:  Evelyn Graham is a 60 y.o. female who presents with constant, dull, achy nonradiating nonmigratory left lower back pain that is getting worse over the past 2 days.  She states that she felt feverish today.  Her temperature here is 100.  She reports body aches and nausea.  She has no urinary or vaginal complaints.  No abdominal pain, pelvic pain, vomiting.  She deep cleaned her house the day before symptoms started and initially attributed her symptoms to a lumbar strain.  She came in because she is getting worse.  No antibiotics in the past month.  No antipyretic in the past 6 hours.  She tried pushing fluids without improvement in her symptoms.  Symptoms are sometimes, but not consistently so, worse with twisting.  Denies syncope, saddle anesthesia, distal weakness/numbness, bilateral radicular leg pain/weakness, bladder/ bowel incontinence, urinary retention. she has a past medical history of UTI, COVID last month, hypertension hypercholesterolemia.  No history of pyelonephritis, nephrolithiasis, IVDU.  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    Hyperlipidemia    Menopause syndrome 08/30/2015   Other fatigue 08/30/2015   Vitamin D deficiency 08/30/2015    Past Surgical History:  Procedure Laterality Date   COLONOSCOPY WITH PROPOFOL N/A 11/21/2015   Procedure: COLONOSCOPY WITH PROPOFOL;  Surgeon: [name], MD;  Location: [facility] ENDOSCOPY;  Service: Endoscopy;  Laterality: N/A;   GANGLION CYST EXCISION Left 2007    Family History  Problem Relation Age of Onset   Hyperlipidemia Mother    Paget's disease of bone Mother    Heart disease Father 2        early forties   Hyperlipidemia Father    Hypertension Father    Cancer Maternal Uncle 61       colon   Heart disease Maternal Grandmother    Heart disease Maternal Grandfather    Cancer Paternal Grandmother        stomach cancer?   Cancer Paternal Grandfather        stomach cancer?   Breast cancer Neg Hx     Social History   Tobacco Use   Smoking status: Never   Smokeless tobacco: Never  Vaping Use   Vaping Use: Never used  Substance Use Topics   Alcohol use: Yes    Alcohol/week: 0.0 standard drinks    Comment: 3 to 4 glasses of wine per week   Drug use: No     Current Facility-Administered Medications:    cefTRIAXone (ROCEPHIN) injection 1 g, 1 g, Intramuscular, Once, Melynda Ripple, MD  Current Outpatient Medications:    cephALEXin (KEFLEX) 500 MG capsule, Take 1 capsule (500 mg total) by mouth 4 (four) times daily for 10 days., Disp: 40 capsule, Rfl: 0   ibuprofen (ADVIL) 600 MG tablet, Take 1 tablet (600 mg total) by mouth every 6 (six) hours as needed., Disp: 30 tablet, Rfl: 0   aspirin 81 MG tablet, Take 81 mg by mouth daily., Disp: , Rfl:    atorvastatin (LIPITOR) 20 MG tablet, TAKE 1 TABLET BY MOUTH EVERYDAY AT BEDTIME, Disp: 90 tablet, Rfl: 3   Glucosamine HCl 1000 MG TABS, Take 4,000 mg by  mouth daily., Disp: , Rfl:    loratadine (CLARITIN) 10 MG tablet, Take 10 mg by mouth daily., Disp: , Rfl:    metoprolol succinate (TOPROL-XL) 50 MG 24 hr tablet, TAKE 1 TABLET (50 MG TOTAL) BY MOUTH DAILY. TAKE WITH OR IMMEDIATELY FOLLOWING A MEAL., Disp: 90 tablet, Rfl: 3   norethindrone-ethinyl estradiol (FEMHRT LOW DOSE) 0.5-2.5 MG-MCG tablet, TAKE 1 TABLET BY MOUTH EVERY DAY, Disp: 84 tablet, Rfl: 2  Allergies  Allergen Reactions   Sulfa Antibiotics Anaphylaxis     ROS  As noted in HPI.   Physical Exam  BP (!) 174/76 (BP Location: Right Arm)   Pulse 89   Temp 100 F (37.8 C) (Oral)   Resp 20   SpO2 98%   Constitutional: Well developed, well nourished,  no acute distress Eyes:  EOMI, conjunctiva normal bilaterally HENT: Normocephalic, atraumatic,mucus membranes moist Respiratory: Normal inspiratory effort Cardiovascular: Normal rate GI: nondistended. No suprapubic, flank tenderness skin: No rash, skin intact Musculoskeletal: Questionable left CVAT.  Positive left-sided paralumbar tenderness, no muscle spasm. No bony tenderness. Bilateral lower extremities nontender, baseline ROM with intact DP  pulses. No pain with int/ext rotation, active flex/extension hips bilaterally. SLR neg bilaterally. Sensation baseline light touch bilaterally for Pt, DTR's symmetric and intact bilaterally KJ, Motor symmetric bilateral 5/5 hip flexion, quadriceps, hamstrings, EHL, foot dorsiflexion, foot plantarflexion, gait normal neurologic: Alert & oriented x 3, no focal neuro deficits Psychiatric: Speech and behavior appropriate  ED Course   Medications  cefTRIAXone (ROCEPHIN) injection 1 g (has no administration in time range)    Orders Placed This Encounter  Procedures   Urine Culture    Standing Status:   Standing    Number of Occurrences:   1    Order Specific Question:   Indication    Answer:   Flank Pain   POCT urinalysis dipstick    Standing Status:   Standing    Number of Occurrences:   1    Results for orders placed or performed during the hospital encounter of 11/27/20 (from the past 24 hour(s))  POCT urinalysis dipstick     Status: Abnormal   Collection Time: 11/27/20  6:18 PM  Result Value Ref Range   Color, UA yellow yellow   Clarity, UA clear clear   Glucose, UA negative negative mg/dL   Bilirubin, UA negative negative   Ketones, POC UA negative negative mg/dL   Spec Grav, UA 1.015 1.010 - 1.025   Blood, UA negative negative   pH, UA 5.5 5.0 - 8.0   Protein Ur, POC negative negative mg/dL   Urobilinogen, UA 0.2 0.2 or 1.0 E.U./dL   Nitrite, UA Negative Negative   Leukocytes, UA Trace (A) Negative   No results found.  ED  Clinical Impression  1. Acute left-sided low back pain without sciatica   2. Fever, unspecified fever cause   3. Urinary tract infection without hematuria, site unspecified     ED Assessment/Plan  Patient has left paralumbar tenderness, questionable left CVA tenderness.  She has trace leukocytes in her urine.  Could be that she has a lumbar strain and concomitant viral illness.  However given the borderline fever and trace leukocytes, concern for an early pyelonephritis.  She appears nontoxic.  Her vitals are otherwise normal.  Will give 1 g of Rocephin and send urine off for culture to confirm diagnosis and antibiotic choice.  Will send with Tylenol/ibuprofen, Keflex for complicated UTI.  Discussed with her that if urine culture is negative  for urinary tract infection, she is to stop the antibiotics and we need to consider other sources of fever if she continues to have back pain and fevers.  Doubt epidural abscess or other emergency today.  She will follow-up with her PMD in several days.  Discussed labs, MDM, treatment plan, and plan for follow-up with patient. Discussed sn/sx that should prompt return to the ED. patient agrees with plan.   Meds ordered this encounter  Medications   cefTRIAXone (ROCEPHIN) injection 1 g   cephALEXin (KEFLEX) 500 MG capsule    Sig: Take 1 capsule (500 mg total) by mouth 4 (four) times daily for 10 days.    Dispense:  40 capsule    Refill:  0   ibuprofen (ADVIL) 600 MG tablet    Sig: Take 1 tablet (600 mg total) by mouth every 6 (six) hours as needed.    Dispense:  30 tablet    Refill:  0    *This clinic note was created using [employer]. Therefore, there may be occasional mistakes despite careful proofreading.  ?     Melynda Ripple, MD 11/28/20 [phone]

## 2020-11-29 LAB — URINE CULTURE: Culture: NO GROWTH

## 2020-12-20 ENCOUNTER — Telehealth: Payer: Self-pay | Admitting: Internal Medicine

## 2020-12-20 DIAGNOSIS — Z1231 Encounter for screening mammogram for malignant neoplasm of breast: Secondary | ICD-10-CM

## 2020-12-20 NOTE — Telephone Encounter (Signed)
Mammogram has been ordered and pt is aware.

## 2020-12-20 NOTE — Telephone Encounter (Signed)
Patient calling in and needing an order placed to [facility] for her annual mammogram.   Patient would like a call back when this is done as they will not schedule her until they get the order.

## 2020-12-20 NOTE — Addendum Note (Signed)
Addended by: Adair Laundry on: 12/20/2020 09:43 AM   Modules accepted: Orders

## 2021-01-10 ENCOUNTER — Ambulatory Visit
Admission: RE | Admit: 2021-01-10 | Discharge: 2021-01-10 | Disposition: A | Discharge status: Home / Self Care | Payer: Federal, State, Local not specified - PPO | Source: Ambulatory Visit | Attending: Internal Medicine | Admitting: Internal Medicine

## 2021-01-10 ENCOUNTER — Other Ambulatory Visit: Payer: Self-pay

## 2021-01-10 DIAGNOSIS — Z1231 Encounter for screening mammogram for malignant neoplasm of breast: Secondary | ICD-10-CM

## 2021-03-23 ENCOUNTER — Other Ambulatory Visit: Payer: Self-pay | Admitting: Internal Medicine

## 2021-03-25 NOTE — Telephone Encounter (Signed)
Pt has been scheduled for 2/6 at 2pm for follow-up

## 2021-03-25 NOTE — Telephone Encounter (Signed)
LMTCB. Need to schedule pt an appt with Dr. Derrel Nip. Pt has not been seen since 2020.

## 2021-04-22 ENCOUNTER — Other Ambulatory Visit: Payer: Self-pay

## 2021-04-22 ENCOUNTER — Encounter: Payer: Self-pay | Admitting: Internal Medicine

## 2021-04-22 ENCOUNTER — Other Ambulatory Visit (HOSPITAL_COMMUNITY)
Admission: RE | Admit: 2021-04-22 | Discharge: 2021-04-22 | Disposition: A | Discharge status: Home / Self Care | Payer: Federal, State, Local not specified - PPO | Source: Ambulatory Visit | Attending: Internal Medicine | Admitting: Internal Medicine

## 2021-04-22 ENCOUNTER — Ambulatory Visit: Payer: Federal, State, Local not specified - PPO | Admitting: Internal Medicine

## 2021-04-22 VITALS — BP 152/92 | HR 66 | Temp 98.2°F | Ht 64.0 in | Wt 149.8 lb

## 2021-04-22 DIAGNOSIS — Z124 Encounter for screening for malignant neoplasm of cervix: Secondary | ICD-10-CM | POA: Diagnosis not present

## 2021-04-22 DIAGNOSIS — I10 Essential (primary) hypertension: Secondary | ICD-10-CM | POA: Diagnosis not present

## 2021-04-22 DIAGNOSIS — R5383 Other fatigue: Secondary | ICD-10-CM | POA: Diagnosis not present

## 2021-04-22 DIAGNOSIS — Z0000 Encounter for general adult medical examination without abnormal findings: Secondary | ICD-10-CM

## 2021-04-22 DIAGNOSIS — R7301 Impaired fasting glucose: Secondary | ICD-10-CM

## 2021-04-22 DIAGNOSIS — E7800 Pure hypercholesterolemia, unspecified: Secondary | ICD-10-CM | POA: Diagnosis not present

## 2021-04-22 DIAGNOSIS — Z1231 Encounter for screening mammogram for malignant neoplasm of breast: Secondary | ICD-10-CM

## 2021-04-22 LAB — CBC WITH DIFFERENTIAL/PLATELET
Basophils Absolute: 0.1 10*3/uL (ref 0.0–0.1)
Basophils Relative: 1 % (ref 0.0–3.0)
Eosinophils Absolute: 0.1 10*3/uL (ref 0.0–0.7)
Eosinophils Relative: 1 % (ref 0.0–5.0)
HCT: 40.1 % (ref 36.0–46.0)
Hemoglobin: 13.3 g/dL (ref 12.0–15.0)
Lymphocytes Relative: 36.8 % (ref 12.0–46.0)
Lymphs Abs: 2.4 10*3/uL (ref 0.7–4.0)
MCHC: 33.2 g/dL (ref 30.0–36.0)
MCV: 92.9 fl (ref 78.0–100.0)
Monocytes Absolute: 0.4 10*3/uL (ref 0.1–1.0)
Monocytes Relative: 6.6 % (ref 3.0–12.0)
Neutro Abs: 3.6 10*3/uL (ref 1.4–7.7)
Neutrophils Relative %: 54.6 % (ref 43.0–77.0)
Platelets: 190 10*3/uL (ref 150.0–400.0)
RBC: 4.32 Mil/uL (ref 3.87–5.11)
RDW: 13.1 % (ref 11.5–15.5)
WBC: 6.6 10*3/uL (ref 4.0–10.5)

## 2021-04-22 LAB — COMPREHENSIVE METABOLIC PANEL
ALT: 20 U/L (ref 0–35)
AST: 14 U/L (ref 0–37)
Albumin: 4.8 g/dL (ref 3.5–5.2)
Alkaline Phosphatase: 94 U/L (ref 39–117)
BUN: 13 mg/dL (ref 6–23)
CO2: 28 mEq/L (ref 19–32)
Calcium: 9.7 mg/dL (ref 8.4–10.5)
Chloride: 104 mEq/L (ref 96–112)
Creatinine, Ser: 0.77 mg/dL (ref 0.40–1.20)
GFR: 85.01 mL/min (ref >60.00)
Glucose, Bld: 92 mg/dL (ref 70–99)
Potassium: 3.8 mEq/L (ref 3.5–5.1)
Sodium: 139 mEq/L (ref 135–145)
Total Bilirubin: 0.8 mg/dL (ref 0.2–1.2)
Total Protein: 7.2 g/dL (ref 6.0–8.3)

## 2021-04-22 LAB — LIPID PANEL
Cholesterol: 165 mg/dL (ref 0–200)
HDL: 62.7 mg/dL (ref >39.00)
LDL Cholesterol: 83 mg/dL (ref 0–99)
NonHDL: 102.72
Total CHOL/HDL Ratio: 3
Triglycerides: 99 mg/dL (ref 0.0–149.0)
VLDL: 19.8 mg/dL (ref 0.0–40.0)

## 2021-04-22 LAB — HEMOGLOBIN A1C: Hgb A1c MFr Bld: 5.5 % (ref 4.6–6.5)

## 2021-04-22 LAB — TSH: TSH: 2.15 u[IU]/mL (ref 0.35–5.50)

## 2021-04-22 NOTE — Assessment & Plan Note (Signed)

## 2021-04-22 NOTE — Patient Instructions (Signed)
°  The new "GOAL"  for  blood pressure is now 120/70, .  Please check your blood pressure a few times at home and send me the readings so I can determine if you need to change your medication to lower your blood pressure .

## 2021-04-23 LAB — MICROALBUMIN / CREATININE URINE RATIO
Creatinine,U: 40.4 mg/dL
Microalb Creat Ratio: 1.7 mg/g (ref 0.0–30.0)
Microalb, Ur: <0.7 mg/dL (ref 0.0–1.9)

## 2021-04-23 NOTE — Assessment & Plan Note (Signed)
Home readings using an 59 yr old wrist cuff have been lower.  Advised to schedule an RN visit with cuff to determine if it is accurate

## 2021-04-23 NOTE — Assessment & Plan Note (Signed)
Treating aggressively with high potency statin n due to carotid stenosis with goal LDL  <100.    Lab Results  Component Value Date   CHOL 165 04/22/2021   HDL 62.70 04/22/2021   LDLCALC 83 04/22/2021   LDLDIRECT 81.0 08/28/2015   TRIG 99.0 04/22/2021   CHOLHDL 3 04/22/2021   Lab Results  Component Value Date   ALT 20 04/22/2021   AST 14 04/22/2021   ALKPHOS 94 04/22/2021   BILITOT 0.8 04/22/2021

## 2021-04-23 NOTE — Progress Notes (Signed)
The patient is here for annual preventive examination and management of other chronic and acute problems.  This visit occurred during the SARS-CoV-2 public health emergency.  Safety protocols were in place, including screening questions prior to the visit, additional usage of staff PPE, and extensive cleaning of exam room while observing appropriate contact time as indicated for disinfecting solutions.     The risk factors are reflected in the social history.   The roster of all physicians providing medical care to patient - is listed in the Snapshot section of the chart.   Activities of daily living:  The patient is 100% independent in all ADLs: dressing, toileting, feeding as well as independent mobility   Home safety : The patient has smoke detectors in the home. They wear seatbelts.  There are no unsecured firearms at home. There is no violence in the home.    There is no risks for hepatitis, STDs or HIV. There is no   history of blood transfusion. They have no travel history to infectious disease endemic areas of the world.   The patient has seen their dentist in the last six month. They have seen their eye doctor in the last year.  They have deferred audiologic testing in the last year.  They do not  have excessive sun exposure. Discussed the need for sun protection: hats, long sleeves and use of sunscreen if there is significant sun exposure.    Diet: the importance of a healthy diet is discussed. They do have a healthy diet.   The benefits of regular aerobic exercise were discussed. The patient  exercises  3  days per week  for  60 minutes.    Depression screen: there are no signs or vegative symptoms of depression- irritability, change in appetite, anhedonia, sadness/tearfullness.   The following portions of the patient's history were reviewed and updated as appropriate: allergies, current medications, past family history, past medical history,  past surgical history, past social  history  and problem list.   Visual acuity was not assessed per patient preference since the patient has regular follow up with an  ophthalmologist. Hearing and body mass index were assessed and reviewed.    During the course of the visit the patient was educated and counseled about appropriate screening and preventive services including : fall prevention , diabetes screening, nutrition counseling, colorectal cancer screening, and recommended immunizations.    Chief Complaint:  HPI     Follow-up    Additional comments: 6 month follow up       Last edited by Adair Laundry, [place] on 04/22/2021  1:52 PM.        Review of Symptoms  Patient denies headache, fevers, malaise, unintentional weight loss, skin rash, eye pain, sinus congestion and sinus pain, sore throat, dysphagia,  hemoptysis , cough, dyspnea, wheezing, chest pain, palpitations, orthopnea, edema, abdominal pain, nausea, melena, diarrhea, constipation, flank pain, dysuria, hematuria, urinary  Frequency, nocturia, numbness, tingling, seizures,  Focal weakness, Loss of consciousness,  Tremor, insomnia, depression, anxiety, and suicidal ideation.    Physical Exam:  BP (!) 152/92 (BP Location: Left Arm, Patient Position: Sitting, Cuff Size: Normal)    Pulse 66    Temp 98.2 F (36.8 C) (Oral)    Ht 5\' 4"  (1.626 m)    Wt 149 lb 12.8 oz (67.9 kg)    SpO2 98%    BMI 25.71 kg/m    General Appearance:    Alert, cooperative, no distress, appears stated age  Head:  Normocephalic, without obvious abnormality, atraumatic  Eyes:    PERRL, conjunctiva/corneas clear, EOM's intact, fundi    benign, both eyes  Ears:    Normal TM's and external ear canals, both ears  Nose:   Nares normal, septum midline, mucosa normal, no drainage    or sinus tenderness  Throat:   Lips, mucosa, and tongue normal; teeth and gums normal  Neck:   Supple, symmetrical, trachea midline, no adenopathy;    thyroid:  no enlargement/tenderness/nodules; no carotid    bruit or JVD  Back:     Symmetric, no curvature, ROM normal, no CVA tenderness  Lungs:     Clear to auscultation bilaterally, respirations unlabored  Chest Wall:    No tenderness or deformity   Heart:    Regular rate and rhythm, S1 and S2 normal, no murmur, rub   or gallop  Breast Exam:    No tenderness, masses, or nipple abnormality  Abdomen:     Soft, non-tender, bowel sounds active all four quadrants,    no masses, no organomegaly  Genitalia:    Pelvic: cervix normal in appearance, external genitalia normal, no adnexal masses or tenderness, no cervical motion tenderness, rectovaginal septum normal, uterus normal size, shape, and consistency and vagina normal without discharge  Extremities:   Extremities normal, atraumatic, no cyanosis or edema  Pulses:   2+ and symmetric all extremities  Skin:   Skin color, texture, turgor normal, no rashes or lesions  Lymph nodes:   Cervical, supraclavicular, and axillary nodes normal  Neurologic:   CNII-XII intact, normal strength, sensation and reflexes    throughout      Assessment and Plan:  Encounter for preventive health examination age appropriate education and counseling updated, referrals for preventative services and immunizations addressed, dietary and smoking counseling addressed, most recent labs reviewed.  I have personally reviewed and have noted:   1) the patient's medical and social history 2) The pt's use of alcohol, tobacco, and illicit drugs 3) The patient's current medications and supplements 4) Functional ability including ADL's, fall risk, home safety risk, hearing and visual impairment 5) Diet and physical activities 6) Evidence for depression or mood disorder 7) The patient's height, weight, and BMI have been recorded in the chart 8) I have ordered and reviewed a 12 lead EKG and find that there are no acute changes and patient is in sinus rhythm.     I have made referrals, and provided counseling and education based on  review of the above  White coat syndrome with hypertension Home readings using an 59 yr old wrist cuff have been lower.  Advised to schedule an RN visit with cuff to determine if it is accurate  Hyperlipidemia Treating aggressively with high potency statin n due to carotid stenosis with goal LDL  <100.    Lab Results  Component Value Date   CHOL 165 04/22/2021   HDL 62.70 04/22/2021   LDLCALC 83 04/22/2021   LDLDIRECT 81.0 08/28/2015   TRIG 99.0 04/22/2021   CHOLHDL 3 04/22/2021   Lab Results  Component Value Date   ALT 20 04/22/2021   AST 14 04/22/2021   ALKPHOS 94 04/22/2021   BILITOT 0.8 04/22/2021     Updated Medication List Outpatient Encounter Medications as of 04/22/2021  Medication Sig   aspirin 81 MG tablet Take 81 mg by mouth daily.   atorvastatin (LIPITOR) 20 MG tablet TAKE 1 TABLET BY MOUTH EVERYDAY AT BEDTIME   loratadine (CLARITIN) 10 MG tablet Take 10 mg  by mouth daily.   metoprolol succinate (TOPROL-XL) 50 MG 24 hr tablet TAKE 1 TABLET (50 MG TOTAL) BY MOUTH DAILY. TAKE WITH OR IMMEDIATELY FOLLOWING A MEAL.   norethindrone-ethinyl estradiol (FEMHRT LOW DOSE) 0.5-2.5 MG-MCG tablet TAKE 1 TABLET BY MOUTH EVERY DAY   [DISCONTINUED] Glucosamine HCl 1000 MG TABS Take 4,000 mg by mouth daily.   [DISCONTINUED] ibuprofen (ADVIL) 600 MG tablet Take 1 tablet (600 mg total) by mouth every 6 (six) hours as needed.   No facility-administered encounter medications on file as of 04/22/2021.

## 2021-04-24 LAB — CYTOLOGY - PAP
Comment: NEGATIVE
Diagnosis: NEGATIVE
High risk HPV: NEGATIVE

## 2021-06-30 ENCOUNTER — Other Ambulatory Visit: Payer: Self-pay | Admitting: Internal Medicine

## 2021-09-25 ENCOUNTER — Other Ambulatory Visit: Payer: Self-pay | Admitting: Internal Medicine

## 2021-11-25 DIAGNOSIS — L821 Other seborrheic keratosis: Secondary | ICD-10-CM | POA: Diagnosis not present

## 2021-11-25 DIAGNOSIS — L578 Other skin changes due to chronic exposure to nonionizing radiation: Secondary | ICD-10-CM | POA: Diagnosis not present

## 2021-11-25 DIAGNOSIS — Z85828 Personal history of other malignant neoplasm of skin: Secondary | ICD-10-CM | POA: Diagnosis not present

## 2021-11-25 DIAGNOSIS — Z872 Personal history of diseases of the skin and subcutaneous tissue: Secondary | ICD-10-CM | POA: Diagnosis not present

## 2021-12-18 ENCOUNTER — Other Ambulatory Visit: Payer: Self-pay | Admitting: Internal Medicine

## 2022-03-14 ENCOUNTER — Other Ambulatory Visit: Payer: Self-pay | Admitting: Internal Medicine

## 2022-04-01 ENCOUNTER — Other Ambulatory Visit: Payer: Self-pay | Admitting: Internal Medicine

## 2022-04-01 DIAGNOSIS — Z1231 Encounter for screening mammogram for malignant neoplasm of breast: Secondary | ICD-10-CM

## 2022-04-21 ENCOUNTER — Ambulatory Visit
Admission: RE | Admit: 2022-04-21 | Discharge: 2022-04-21 | Disposition: A | Discharge status: Home / Self Care | Payer: Federal, State, Local not specified - PPO | Source: Ambulatory Visit | Attending: Internal Medicine | Admitting: Internal Medicine

## 2022-04-21 DIAGNOSIS — Z1231 Encounter for screening mammogram for malignant neoplasm of breast: Secondary | ICD-10-CM | POA: Insufficient documentation

## 2022-04-23 ENCOUNTER — Encounter: Payer: Federal, State, Local not specified - PPO | Admitting: Internal Medicine

## 2022-05-16 ENCOUNTER — Encounter: Payer: Self-pay | Admitting: Internal Medicine

## 2022-05-16 ENCOUNTER — Ambulatory Visit (INDEPENDENT_AMBULATORY_CARE_PROVIDER_SITE_OTHER): Payer: Federal, State, Local not specified - PPO | Admitting: Internal Medicine

## 2022-05-16 VITALS — BP 160/92 | HR 61 | Temp 97.5°F | Ht 64.0 in | Wt 141.6 lb

## 2022-05-16 DIAGNOSIS — E7800 Pure hypercholesterolemia, unspecified: Secondary | ICD-10-CM

## 2022-05-16 DIAGNOSIS — I10 Essential (primary) hypertension: Secondary | ICD-10-CM | POA: Diagnosis not present

## 2022-05-16 DIAGNOSIS — I6523 Occlusion and stenosis of bilateral carotid arteries: Secondary | ICD-10-CM

## 2022-05-16 DIAGNOSIS — R5383 Other fatigue: Secondary | ICD-10-CM

## 2022-05-16 DIAGNOSIS — Z0000 Encounter for general adult medical examination without abnormal findings: Secondary | ICD-10-CM | POA: Diagnosis not present

## 2022-05-16 DIAGNOSIS — R7301 Impaired fasting glucose: Secondary | ICD-10-CM | POA: Diagnosis not present

## 2022-05-16 LAB — CBC WITH DIFFERENTIAL/PLATELET
Basophils Absolute: 0 10*3/uL (ref 0.0–0.1)
Basophils Relative: 0.7 % (ref 0.0–3.0)
Eosinophils Absolute: 0 10*3/uL (ref 0.0–0.7)
Eosinophils Relative: 0.4 % (ref 0.0–5.0)
HCT: 40.2 % (ref 36.0–46.0)
Hemoglobin: 13.6 g/dL (ref 12.0–15.0)
Lymphocytes Relative: 30.4 % (ref 12.0–46.0)
Lymphs Abs: 2 10*3/uL (ref 0.7–4.0)
MCHC: 33.9 g/dL (ref 30.0–36.0)
MCV: 92.3 fl (ref 78.0–100.0)
Monocytes Absolute: 0.5 10*3/uL (ref 0.1–1.0)
Monocytes Relative: 7.3 % (ref 3.0–12.0)
Neutro Abs: 4 10*3/uL (ref 1.4–7.7)
Neutrophils Relative %: 61.2 % (ref 43.0–77.0)
Platelets: 221 10*3/uL (ref 150.0–400.0)
RBC: 4.35 Mil/uL (ref 3.87–5.11)
RDW: 13.2 % (ref 11.5–15.5)
WBC: 6.5 10*3/uL (ref 4.0–10.5)

## 2022-05-16 LAB — COMPREHENSIVE METABOLIC PANEL
ALT: 24 U/L (ref 0–35)
AST: 18 U/L (ref 0–37)
Albumin: 4.7 g/dL (ref 3.5–5.2)
Alkaline Phosphatase: 77 U/L (ref 39–117)
BUN: 13 mg/dL (ref 6–23)
CO2: 26 mEq/L (ref 19–32)
Calcium: 10 mg/dL (ref 8.4–10.5)
Chloride: 105 mEq/L (ref 96–112)
Creatinine, Ser: 0.85 mg/dL (ref 0.40–1.20)
GFR: 74.94 mL/min (ref >60.00)
Glucose, Bld: 91 mg/dL (ref 70–99)
Potassium: 4.2 mEq/L (ref 3.5–5.1)
Sodium: 140 mEq/L (ref 135–145)
Total Bilirubin: 1.4 mg/dL — ABNORMAL HIGH (ref 0.2–1.2)
Total Protein: 7.3 g/dL (ref 6.0–8.3)

## 2022-05-16 LAB — LIPID PANEL
Cholesterol: 156 mg/dL (ref 0–200)
HDL: 68.8 mg/dL (ref >39.00)
LDL Cholesterol: 72 mg/dL (ref 0–99)
NonHDL: 87.16
Total CHOL/HDL Ratio: 2
Triglycerides: 78 mg/dL (ref 0.0–149.0)
VLDL: 15.6 mg/dL (ref 0.0–40.0)

## 2022-05-16 LAB — HEMOGLOBIN A1C: Hgb A1c MFr Bld: 5.6 % (ref 4.6–6.5)

## 2022-05-16 LAB — MICROALBUMIN / CREATININE URINE RATIO
Creatinine,U: 162.3 mg/dL
Microalb Creat Ratio: 0.6 mg/g (ref 0.0–30.0)
Microalb, Ur: 1 mg/dL (ref 0.0–1.9)

## 2022-05-16 LAB — LDL CHOLESTEROL, DIRECT: Direct LDL: 76 mg/dL

## 2022-05-16 LAB — TSH: TSH: 2.14 u[IU]/mL (ref 0.35–5.50)

## 2022-05-16 MED ORDER — ATORVASTATIN CALCIUM 20 MG PO TABS: 20.0000 mg | ORAL_TABLET | Freq: Every day | ORAL | 3 refills | Status: DC

## 2022-05-16 MED ORDER — NORETHINDRONE-ETH ESTRADIOL 0.5-2.5 MG-MCG PO TABS: 1.0000 | ORAL_TABLET | Freq: Every day | ORAL | 0 refills | Status: DC

## 2022-05-16 MED ORDER — METOPROLOL SUCCINATE ER 50 MG PO TB24: 50.0000 mg | ORAL_TABLET | Freq: Every day | ORAL | 3 refills | Status: DC

## 2022-05-16 NOTE — Assessment & Plan Note (Signed)

## 2022-05-16 NOTE — Patient Instructions (Addendum)
For your insomnia issues  I recommend a trial of  Relaxium ( seen on TV commercials) . 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 I recommend seeing Dr Daron Offer.  TDAP due in July 2025

## 2022-05-16 NOTE — Assessment & Plan Note (Addendum)
she reports compliance with medication regimen  but has an elevated reading today in office.  She is not using NSAIDs daily.  Discussed goal of 120/70  (130/80 for patients over 70)  to preserve renal function.  She has been asked to check her  BP  at home and  submit readings for evaluation. Renal function, electrolytes and screen for proteinuria are all normal .  Lab Results  Component Value Date   CREATININE 0.85 05/16/2022   Lab Results  Component Value Date   NA 140 05/16/2022   K 4.2 05/16/2022   CL 105 05/16/2022   CO2 26 05/16/2022   Lab Results  Component Value Date   MICROALBUR 1.0 05/16/2022   MICROALBUR <0.7 04/22/2021

## 2022-05-16 NOTE — Progress Notes (Unsigned)
Patient ID: Evelyn Graham, female    DOB: Sep 18, 1960  Age: 62 y.o. MRN: [id number]  The patient is here for annual preventive examination and management of other chronic and acute problems.   The risk factors are reflected in the social history.   The roster of all physicians providing medical care to patient - is listed in the Snapshot section of the chart.   Activities of daily living:  The patient is 100% independent in all ADLs: dressing, toileting, feeding as well as independent mobility   Home safety : The patient has smoke detectors in the home. They wear seatbelts.  There are no unsecured firearms at home. There is no violence in the home.    There is no risks for hepatitis, STDs or HIV. There is no   history of blood transfusion. They have no travel history to infectious disease endemic areas of the world.   The patient has seen their dentist in the last six month. They have seen their eye doctor in the last year. The patinet  denies slight hearing difficulty with regard to whispered voices and some television programs.  They have deferred audiologic testing in the last year.  They do not  have excessive sun exposure. Discussed the need for sun protection: hats, long sleeves and use of sunscreen if there is significant sun exposure.    Diet: the importance of a healthy diet is discussed. They do have a healthy diet.   The benefits of regular aerobic exercise were discussed. The patient  exercises  3 to 5 days per week  for  60 minutes.    Depression screen: there are no signs or vegative symptoms of depression- irritability, change in appetite, anhedonia, sadness/tearfullness.   The following portions of the patient's history were reviewed and updated as appropriate: allergies, current medications, past family history, past medical history,  past surgical history, past social history  and problem list.   Visual acuity was not assessed per patient preference since the patient  has regular follow up with an  ophthalmologist. Hearing and body mass index were assessed and reviewed.    During the course of the visit the patient was educated and counseled about appropriate screening and preventive services including : fall prevention , diabetes screening, nutrition counseling, colorectal cancer screening, and recommended immunizations.    Chief Complaint:  Retiring  in August. Mother has been significantly declining since her fall  in the shower last August  ,  60 yrs old and living independently  . Was able to get to the phone and call patient . 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  Has 30 yrs in August   Insomnia: chronic for 30 yrs     Review of Symptoms  Patient denies headache, fevers, malaise, unintentional weight loss, skin rash, eye pain, sinus congestion and sinus pain, sore throat, dysphagia,  hemoptysis , cough, dyspnea, wheezing, chest pain, palpitations, orthopnea, edema, abdominal pain, nausea, melena, diarrhea, constipation, flank pain, dysuria, hematuria, urinary  Frequency, nocturia, numbness, tingling, seizures,  Focal weakness, Loss of consciousness,  Tremor, insomnia, depression, anxiety, and suicidal ideation.    Physical Exam:  BP (!) 140/88   Pulse 61   Temp (!) 97.5 F (36.4 C) (Oral)   Ht '5\' 4"'$  (1.626 m)   Wt 141 lb 9.6 oz (64.2 kg)   SpO2 99%   BMI 24.31 kg/m    Physical Exam Vitals reviewed.  Constitutional:      General: She is not in  acute distress.    Appearance: Normal appearance. She is normal weight. She is not ill-appearing, toxic-appearing or diaphoretic.  HENT:     Head: Normocephalic.  Eyes:     General: No scleral icterus.       Right eye: No discharge.        Left eye: No discharge.     Conjunctiva/sclera: Conjunctivae normal.  Cardiovascular:     Rate and Rhythm: Normal rate and regular rhythm.     Heart sounds: Normal heart sounds.  Pulmonary:     Effort: Pulmonary effort is normal. No respiratory  distress.     Breath sounds: Normal breath sounds.  Musculoskeletal:        General: Normal range of motion.  Skin:    General: Skin is warm and dry.  Neurological:     General: No focal deficit present.     Mental Status: She is alert and oriented to person, place, and time. Mental status is at baseline.  Psychiatric:        Mood and Affect: Mood normal.        Behavior: Behavior normal.        Thought Content: Thought content normal.        Judgment: Judgment normal.     Assessment and Plan: Pure hypercholesterolemia  Other fatigue  Encounter for preventive health examination  Essential hypertension  Impaired fasting glucose    No follow-ups on file.  Evelyn Mc, MD

## 2022-05-18 NOTE — Assessment & Plan Note (Signed)
Screening labs normal.  No history of snoring.  Recommended participating in regular exercise program with goal of achieving a minimum of 30 minutes of aerobic activity 5 days per week.   Lab Results  Component Value Date   CREATININE 0.79 04/16/2015   Lab Results  Component Value Date   ALT 14 04/16/2015   AST 18 04/16/2015   ALKPHOS 69 04/16/2015   BILITOT 0.6 04/16/2015   Lab Results  Component Value Date   TSH 1.68 04/16/2015   Lab Results  Component Value Date   WBC 8.6 04/16/2015   HGB 14.7 04/16/2015   HCT 43.8 04/16/2015   MCV 92.7 04/16/2015   PLT 248.0 04/16/2015

## 2022-05-18 NOTE — Assessment & Plan Note (Addendum)
She is overdue to follow up,  as Her carotid duplex was last done in 2019 by [facility] and showed " only some intimal thickening bilaterally without hemodynamically significant carotid stenosis in either carotid artery " and  follow up every 2 to 3 year was advised.  Carotid dopplers ordered  .  Continue asa and statin .

## 2022-05-18 NOTE — Assessment & Plan Note (Addendum)
Managed aggressively with high potency statin  due to carotid stenosis with goal LDL  <100.    Lab Results  Component Value Date   CHOL 156 05/16/2022   HDL 68.80 05/16/2022   LDLCALC 72 05/16/2022   LDLDIRECT 76.0 05/16/2022   TRIG 78.0 05/16/2022   CHOLHDL 2 05/16/2022   Lab Results  Component Value Date   ALT 24 05/16/2022   AST 18 05/16/2022   ALKPHOS 77 05/16/2022   BILITOT 1.4 (H) 05/16/2022

## 2022-05-28 ENCOUNTER — Ambulatory Visit (INDEPENDENT_AMBULATORY_CARE_PROVIDER_SITE_OTHER): Payer: Federal, State, Local not specified - PPO

## 2022-05-28 DIAGNOSIS — I6523 Occlusion and stenosis of bilateral carotid arteries: Secondary | ICD-10-CM | POA: Diagnosis not present

## 2022-08-13 ENCOUNTER — Other Ambulatory Visit: Payer: Self-pay | Admitting: Internal Medicine

## 2022-11-16 HISTORY — PX: SKIN CANCER EXCISION: SHX779

## 2022-11-24 DIAGNOSIS — L578 Other skin changes due to chronic exposure to nonionizing radiation: Secondary | ICD-10-CM | POA: Diagnosis not present

## 2022-11-24 DIAGNOSIS — C44519 Basal cell carcinoma of skin of other part of trunk: Secondary | ICD-10-CM | POA: Diagnosis not present

## 2022-11-24 DIAGNOSIS — B078 Other viral warts: Secondary | ICD-10-CM | POA: Diagnosis not present

## 2022-11-24 DIAGNOSIS — D485 Neoplasm of uncertain behavior of skin: Secondary | ICD-10-CM | POA: Diagnosis not present

## 2022-11-24 DIAGNOSIS — Z872 Personal history of diseases of the skin and subcutaneous tissue: Secondary | ICD-10-CM | POA: Diagnosis not present

## 2022-11-24 DIAGNOSIS — Z85828 Personal history of other malignant neoplasm of skin: Secondary | ICD-10-CM | POA: Diagnosis not present

## 2022-12-16 DIAGNOSIS — C4491 Basal cell carcinoma of skin, unspecified: Secondary | ICD-10-CM | POA: Diagnosis not present

## 2022-12-16 DIAGNOSIS — C44519 Basal cell carcinoma of skin of other part of trunk: Secondary | ICD-10-CM | POA: Diagnosis not present

## 2023-01-27 ENCOUNTER — Other Ambulatory Visit: Payer: Self-pay | Admitting: Internal Medicine

## 2023-04-14 DIAGNOSIS — Z23 Encounter for immunization: Secondary | ICD-10-CM | POA: Diagnosis not present

## 2023-04-29 ENCOUNTER — Other Ambulatory Visit: Payer: Self-pay | Admitting: Internal Medicine

## 2023-04-29 DIAGNOSIS — Z1231 Encounter for screening mammogram for malignant neoplasm of breast: Secondary | ICD-10-CM

## 2023-05-05 ENCOUNTER — Ambulatory Visit
Admission: RE | Admit: 2023-05-05 | Discharge: 2023-05-05 | Disposition: A | Discharge status: Home / Self Care | Payer: Federal, State, Local not specified - PPO | Source: Ambulatory Visit | Attending: Internal Medicine | Admitting: Internal Medicine

## 2023-05-05 DIAGNOSIS — Z1231 Encounter for screening mammogram for malignant neoplasm of breast: Secondary | ICD-10-CM

## 2023-05-20 ENCOUNTER — Ambulatory Visit: Payer: Federal, State, Local not specified - PPO | Admitting: Internal Medicine

## 2023-05-20 ENCOUNTER — Encounter: Payer: Self-pay | Admitting: Internal Medicine

## 2023-05-20 ENCOUNTER — Telehealth: Payer: Self-pay | Admitting: Internal Medicine

## 2023-05-20 VITALS — BP 112/70 | HR 61 | Temp 98.6°F | Ht 64.25 in | Wt 138.8 lb

## 2023-05-20 DIAGNOSIS — R419 Unspecified symptoms and signs involving cognitive functions and awareness: Secondary | ICD-10-CM | POA: Diagnosis not present

## 2023-05-20 DIAGNOSIS — Z7184 Encounter for health counseling related to travel: Secondary | ICD-10-CM | POA: Diagnosis not present

## 2023-05-20 DIAGNOSIS — Z23 Encounter for immunization: Secondary | ICD-10-CM

## 2023-05-20 DIAGNOSIS — Z0000 Encounter for general adult medical examination without abnormal findings: Secondary | ICD-10-CM | POA: Diagnosis not present

## 2023-05-20 DIAGNOSIS — I10 Essential (primary) hypertension: Secondary | ICD-10-CM

## 2023-05-20 DIAGNOSIS — E7800 Pure hypercholesterolemia, unspecified: Secondary | ICD-10-CM

## 2023-05-20 DIAGNOSIS — R5383 Other fatigue: Secondary | ICD-10-CM | POA: Diagnosis not present

## 2023-05-20 DIAGNOSIS — R7301 Impaired fasting glucose: Secondary | ICD-10-CM

## 2023-05-20 LAB — CBC WITH DIFFERENTIAL/PLATELET
Basophils Absolute: 0.1 10*3/uL (ref 0.0–0.1)
Basophils Relative: 1.1 % (ref 0.0–3.0)
Eosinophils Absolute: 0 10*3/uL (ref 0.0–0.7)
Eosinophils Relative: 0.6 % (ref 0.0–5.0)
HCT: 40.4 % (ref 36.0–46.0)
Hemoglobin: 13.7 g/dL (ref 12.0–15.0)
Lymphocytes Relative: 32.5 % (ref 12.0–46.0)
Lymphs Abs: 1.9 10*3/uL (ref 0.7–4.0)
MCHC: 33.8 g/dL (ref 30.0–36.0)
MCV: 94.7 fl (ref 78.0–100.0)
Monocytes Absolute: 0.4 10*3/uL (ref 0.1–1.0)
Monocytes Relative: 7.6 % (ref 3.0–12.0)
Neutro Abs: 3.4 10*3/uL (ref 1.4–7.7)
Neutrophils Relative %: 58.2 % (ref 43.0–77.0)
Platelets: 190 10*3/uL (ref 150.0–400.0)
RBC: 4.27 Mil/uL (ref 3.87–5.11)
RDW: 13 % (ref 11.5–15.5)
WBC: 5.8 10*3/uL (ref 4.0–10.5)

## 2023-05-20 LAB — LIPID PANEL
Cholesterol: 157 mg/dL (ref 0–200)
HDL: 71.5 mg/dL (ref >39.00)
LDL Cholesterol: 67 mg/dL (ref 0–99)
NonHDL: 85.76
Total CHOL/HDL Ratio: 2
Triglycerides: 95 mg/dL (ref 0.0–149.0)
VLDL: 19 mg/dL (ref 0.0–40.0)

## 2023-05-20 LAB — MICROALBUMIN / CREATININE URINE RATIO
Creatinine,U: 152.4 mg/dL
Microalb Creat Ratio: 4.9 mg/g (ref 0.0–30.0)
Microalb, Ur: 0.7 mg/dL (ref 0.0–1.9)

## 2023-05-20 LAB — COMPREHENSIVE METABOLIC PANEL
ALT: 33 U/L (ref 0–35)
AST: 26 U/L (ref 0–37)
Albumin: 4.7 g/dL (ref 3.5–5.2)
Alkaline Phosphatase: 78 U/L (ref 39–117)
BUN: 9 mg/dL (ref 6–23)
CO2: 27 meq/L (ref 19–32)
Calcium: 9.6 mg/dL (ref 8.4–10.5)
Chloride: 104 meq/L (ref 96–112)
Creatinine, Ser: 0.83 mg/dL (ref 0.40–1.20)
GFR: 76.57 mL/min (ref >60.00)
Glucose, Bld: 98 mg/dL (ref 70–99)
Potassium: 3.9 meq/L (ref 3.5–5.1)
Sodium: 139 meq/L (ref 135–145)
Total Bilirubin: 1.5 mg/dL — ABNORMAL HIGH (ref 0.2–1.2)
Total Protein: 7.4 g/dL (ref 6.0–8.3)

## 2023-05-20 LAB — TSH: TSH: 1.7 u[IU]/mL (ref 0.35–5.50)

## 2023-05-20 LAB — LDL CHOLESTEROL, DIRECT: Direct LDL: 75 mg/dL

## 2023-05-20 LAB — HEMOGLOBIN A1C: Hgb A1c MFr Bld: 5.5 % (ref 4.6–6.5)

## 2023-05-20 MED ORDER — NORETHINDRONE-ETH ESTRADIOL 0.5-2.5 MG-MCG PO TABS: 1.0000 | ORAL_TABLET | Freq: Every day | ORAL | 1 refills | Status: DC

## 2023-05-20 MED ORDER — METOPROLOL SUCCINATE ER 50 MG PO TB24: 50.0000 mg | ORAL_TABLET | Freq: Every day | ORAL | 3 refills | Status: AC

## 2023-05-20 MED ORDER — ATORVASTATIN CALCIUM 20 MG PO TABS: 20.0000 mg | ORAL_TABLET | Freq: Every day | ORAL | 3 refills | Status: AC

## 2023-05-20 MED ORDER — ATOVAQUONE-PROGUANIL HCL 250-100 MG PO TABS: 1.0000 | ORAL_TABLET | Freq: Every day | ORAL | 0 refills | Status: DC

## 2023-05-20 NOTE — Telephone Encounter (Signed)
 Pt was scheduled for labs in 6 months at checkout, no labs please place lab orders

## 2023-05-20 NOTE — Assessment & Plan Note (Signed)
 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  I have sent malarone to [facility] instead

## 2023-05-20 NOTE — Patient Instructions (Addendum)
 Check with your insurance ASAP to see if they will cover  the blood tests to see if you have antibodies to Measles Mumps and Rubella,  and Polio  If they do,  you can schedule a lab visit to have them done here.    You might want to try using Relaxium for insomnia  (as seen on TV commercials) . It is available through [employer] and contains all natural supplements:  Melatonin 5 mg  Chamomile 25 mg Passionflower extract 75 mg GABA 100 mg Ashwaganda extract 125 mg Magnesium citrate, glycinate, oxide (100 mg)  L tryptophan 500 mg Valerest (proprietary  ingredient ; probably valeria root extract)     If this does not work,  e mail me for trial of trazodone.      I highly recommend reading "the End of Alzheimer's: The First Program to Prevent and Reverse Cognitive Decline"  by Dorice Lamas, MD

## 2023-05-20 NOTE — Telephone Encounter (Signed)
 Pt would like to have labs done before her 6 month follow up. I have pended labs for your approval.

## 2023-05-20 NOTE — Assessment & Plan Note (Signed)

## 2023-05-20 NOTE — Progress Notes (Signed)
 Patient ID: Evelyn Graham, female    DOB: 09-16-1960  Age: 63 y.o. MRN: [id number]  The patient is here for annual preventive examination and management of other chronic and acute problems.   The risk factors are reflected in the social history.   The roster of all physicians providing medical care to patient - is listed in the Snapshot section of the chart.   Activities of daily living:  The patient is 100% independent in all ADLs: dressing, toileting, feeding as well as independent mobility   Home safety : The patient has smoke detectors in the home. They wear seatbelts.  There are no unsecured firearms at home. There is no violence in the home.    There is no risks for hepatitis, STDs or HIV. There is no   history of blood transfusion. They have no travel history to infectious disease endemic areas of the world.   The patient has seen their dentist in the last six month. They have seen their eye doctor in the last year. The patinet  denies slight hearing difficulty with regard to whispered voices and some television programs.  They have deferred audiologic testing in the last year.  They do not  have excessive sun exposure. Discussed the need for sun protection: hats, long sleeves and use of sunscreen if there is significant sun exposure.    Diet: the importance of a healthy diet is discussed. They do have a healthy diet.   The benefits of regular aerobic exercise were discussed. The patient  exercises  3 to 5 days per week  for  60 minutes.    Depression screen: there are no signs or vegative symptoms of depression- irritability, change in appetite, anhedonia, sadness/tearfullness.   The following portions of the patient's history were reviewed and updated as appropriate: allergies, current medications, past family history, past medical history,  past surgical history, past social history  and problem list.   Visual acuity was not assessed per patient preference since the patient  has regular follow up with an  ophthalmologist. Hearing and body mass index were assessed and reviewed.    During the course of the visit the patient was educated and counseled about appropriate screening and preventive services including : fall prevention , diabetes screening, nutrition counseling, colorectal cancer screening, and recommended immunizations.    Chief Complaint:    None .  Retired I from the [employer] n August  Travelling to [place] , [place], and [place] .  Some "glamping" and some big city.   Got meds from the [facility] Dept  and received cephalexin for malaria prevention and azithromycin  for diarrhea  received dose #2 of Hep B ,  one dose of Hep a   Review of Symptoms  Patient denies headache, fevers, malaise, unintentional weight loss, skin rash, eye pain, sinus congestion and sinus pain, sore throat, dysphagia,  hemoptysis , cough, dyspnea, wheezing, chest pain, palpitations, orthopnea, edema, abdominal pain, nausea, melena, diarrhea, constipation, flank pain, dysuria, hematuria, urinary  Frequency, nocturia, numbness, tingling, seizures,  Focal weakness, Loss of consciousness,  Tremor, insomnia, depression, anxiety, and suicidal ideation.    Physical Exam:  BP 112/70   Pulse 61   Temp 98.6 F (37 C) (Oral)   Ht 5' 4.25" (1.632 m)   Wt 138 lb 12.8 oz (63 kg)   SpO2 98%   BMI 23.64 kg/m    Physical Exam Vitals reviewed.  Constitutional:      General: She is not  in acute distress.    Appearance: Normal appearance. She is normal weight. She is not ill-appearing, toxic-appearing or diaphoretic.  HENT:     Head: Normocephalic.  Eyes:     General: No scleral icterus.       Right eye: No discharge.        Left eye: No discharge.     Conjunctiva/sclera: Conjunctivae normal.  Cardiovascular:     Rate and Rhythm: Normal rate and regular rhythm.     Heart sounds: Normal heart sounds.  Pulmonary:     Effort: Pulmonary effort is normal. No respiratory  distress.     Breath sounds: Normal breath sounds.  Musculoskeletal:        General: Normal range of motion.  Skin:    General: Skin is warm and dry.  Neurological:     General: No focal deficit present.     Mental Status: She is alert and oriented to person, place, and time. Mental status is at baseline.  Psychiatric:        Mood and Affect: Mood normal.        Behavior: Behavior normal.        Thought Content: Thought content normal.        Judgment: Judgment normal.    Assessment and Plan: White coat syndrome with hypertension Assessment & Plan: she reports compliance with medication regimen  but has an elevated reading today in office.  She is not using NSAIDs daily.  Discussed goal of 120/70  (130/80 for patients over 70)  to preserve renal function.  She has been asked to check her  BP  at home and  submit readings for evaluation. Renal function, electrolytes and screen for proteinuria are all normal .  Lab Results  Component Value Date   CREATININE 0.83 05/20/2023   Lab Results  Component Value Date   NA 139 05/20/2023   K 3.9 05/20/2023   CL 104 05/20/2023   CO2 27 05/20/2023   Lab Results  Component Value Date   MICROALBUR 0.7 05/20/2023   MICROALBUR 1.0 05/16/2022       Orders: -     Comprehensive metabolic panel -     Microalbumin / creatinine urine ratio  Pure hypercholesterolemia -     Lipid panel -     LDL cholesterol, direct  Other fatigue -     CBC with Differential/Platelet -     TSH  Encounter for preventive health examination Assessment & Plan: age appropriate education and counseling updated, referrals for preventative services and immunizations addressed, dietary and smoking counseling addressed, most recent labs reviewed.  I have personally reviewed and have noted:   1) the patient's medical and social history 2) The pt's use of alcohol, tobacco, and illicit drugs 3) The patient's current medications and supplements 4) Functional  ability including ADL's, fall risk, home safety risk, hearing and visual impairment 5) Diet and physical activities 6) Evidence for depression or mood disorder 7) The patient's height, weight, and BMI have been recorded in the chart    I have made referrals, and provided counseling and education based on review of the above    Impaired fasting glucose -     Comprehensive metabolic panel -     Hemoglobin A1c  Travel advice encounter Assessment & Plan: Patient was given cephalexin by the [facility] for malaria prophylaxis; however it is not listed as an option for use in areas where 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  Will offer malarone  or doxycycline    Cognitive complaints with normal neuropsychological exam Assessment & Plan: Reassurance provided.  recommend reading "the End of Alzheimer's: The First Program to Prevent and Reverse Cognitive Decline"  by Dorice Lamas, MD     Other orders -     Atorvastatin Calcium; Take 1 tablet (20 mg total) by mouth daily.  Dispense: 90 tablet; Refill: 3 -     Metoprolol Succinate ER; Take 1 tablet (50 mg total) by mouth daily. TAKE WITH OR IMMEDIATELY FOLLOWING A MEAL.  Dispense: 90 tablet; Refill: 3 -     Norethindrone-Eth Estradiol; Take 1 tablet by mouth daily.  Dispense: 84 tablet; Refill: 1    Return in about 6 months (around 11/20/2023) for hypertension.  Sherlene Shams, MD

## 2023-05-20 NOTE — Assessment & Plan Note (Signed)
 she reports compliance with medication regimen  but has an elevated reading today in office.  She is not using NSAIDs daily.  Discussed goal of 120/70  (130/80 for patients over 70)  to preserve renal function.  She has been asked to check her  BP  at home and  submit readings for evaluation. Renal function, electrolytes and screen for proteinuria are all normal .  Lab Results  Component Value Date   CREATININE 0.83 05/20/2023   Lab Results  Component Value Date   NA 139 05/20/2023   K 3.9 05/20/2023   CL 104 05/20/2023   CO2 27 05/20/2023   Lab Results  Component Value Date   MICROALBUR 0.7 05/20/2023   MICROALBUR 1.0 05/16/2022

## 2023-05-20 NOTE — Assessment & Plan Note (Signed)
 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  Will offer malarone  or doxycycline

## 2023-05-20 NOTE — Assessment & Plan Note (Signed)
 Reassurance provided.  recommend reading "the End of Alzheimer's: The First Program to Prevent and Reverse Cognitive Decline"  by Dorice Lamas, MD

## 2023-05-21 ENCOUNTER — Encounter: Payer: Self-pay | Admitting: Internal Medicine

## 2023-05-21 DIAGNOSIS — Z23 Encounter for immunization: Secondary | ICD-10-CM | POA: Insufficient documentation

## 2023-05-21 NOTE — Telephone Encounter (Signed)
 Do you know the answer to this. I'm not sure about CPT codes.

## 2023-05-21 NOTE — Assessment & Plan Note (Signed)
 Patient is requesting titers to determine if revaccination is needed.  Advised to check with her insurance

## 2023-05-21 NOTE — Assessment & Plan Note (Signed)
 Patient is requesting antibody titers to determine if revaccination is needed.  Advised to check with her insurance

## 2023-05-25 NOTE — Addendum Note (Signed)
 Addended by: Sandy Salaam on: 05/25/2023 12:08 PM   Modules accepted: Orders

## 2023-05-27 DIAGNOSIS — H2513 Age-related nuclear cataract, bilateral: Secondary | ICD-10-CM | POA: Diagnosis not present

## 2023-06-17 ENCOUNTER — Telehealth: Payer: Self-pay | Admitting: Internal Medicine

## 2023-06-17 ENCOUNTER — Encounter: Payer: Self-pay | Admitting: Internal Medicine

## 2023-06-17 NOTE — Telephone Encounter (Signed)
 Left message to call or reschedule 11/20/2023 appointment to another day, Dr Darrick Huntsman is out.

## 2023-11-18 ENCOUNTER — Other Ambulatory Visit

## 2023-11-18 ENCOUNTER — Other Ambulatory Visit (INDEPENDENT_AMBULATORY_CARE_PROVIDER_SITE_OTHER)

## 2023-11-18 DIAGNOSIS — E7800 Pure hypercholesterolemia, unspecified: Secondary | ICD-10-CM

## 2023-11-18 LAB — COMPREHENSIVE METABOLIC PANEL WITH GFR
ALT: 20 U/L (ref 0–35)
AST: 16 U/L (ref 0–37)
Albumin: 4.2 g/dL (ref 3.5–5.2)
Alkaline Phosphatase: 71 U/L (ref 39–117)
BUN: 12 mg/dL (ref 6–23)
CO2: 27 meq/L (ref 19–32)
Calcium: 8.9 mg/dL (ref 8.4–10.5)
Chloride: 105 meq/L (ref 96–112)
Creatinine, Ser: 0.79 mg/dL (ref 0.40–1.20)
GFR: 80.96 mL/min (ref >60.00)
Glucose, Bld: 100 mg/dL — ABNORMAL HIGH (ref 70–99)
Potassium: 3.8 meq/L (ref 3.5–5.1)
Sodium: 139 meq/L (ref 135–145)
Total Bilirubin: 1.1 mg/dL (ref 0.2–1.2)
Total Protein: 6.5 g/dL (ref 6.0–8.3)

## 2023-11-18 LAB — LIPID PANEL
Cholesterol: 153 mg/dL (ref 0–200)
HDL: 62.7 mg/dL (ref >39.00)
LDL Cholesterol: 73 mg/dL (ref 0–99)
NonHDL: 90.72
Total CHOL/HDL Ratio: 2
Triglycerides: 91 mg/dL (ref 0.0–149.0)
VLDL: 18.2 mg/dL (ref 0.0–40.0)

## 2023-11-18 LAB — LDL CHOLESTEROL, DIRECT: Direct LDL: 77 mg/dL

## 2023-11-20 ENCOUNTER — Ambulatory Visit: Admitting: Internal Medicine

## 2023-11-21 ENCOUNTER — Ambulatory Visit: Payer: Self-pay | Admitting: Internal Medicine

## 2023-11-24 ENCOUNTER — Ambulatory Visit: Admitting: Internal Medicine

## 2023-11-24 ENCOUNTER — Encounter: Payer: Self-pay | Admitting: Internal Medicine

## 2023-11-24 VITALS — BP 126/73 | HR 71 | Ht 64.25 in | Wt 132.8 lb

## 2023-11-24 DIAGNOSIS — Z636 Dependent relative needing care at home: Secondary | ICD-10-CM | POA: Insufficient documentation

## 2023-11-24 DIAGNOSIS — Z872 Personal history of diseases of the skin and subcutaneous tissue: Secondary | ICD-10-CM | POA: Diagnosis not present

## 2023-11-24 DIAGNOSIS — L570 Actinic keratosis: Secondary | ICD-10-CM | POA: Diagnosis not present

## 2023-11-24 DIAGNOSIS — Z136 Encounter for screening for cardiovascular disorders: Secondary | ICD-10-CM | POA: Diagnosis not present

## 2023-11-24 DIAGNOSIS — I10 Essential (primary) hypertension: Secondary | ICD-10-CM

## 2023-11-24 DIAGNOSIS — E7800 Pure hypercholesterolemia, unspecified: Secondary | ICD-10-CM | POA: Diagnosis not present

## 2023-11-24 DIAGNOSIS — Z7184 Encounter for health counseling related to travel: Secondary | ICD-10-CM

## 2023-11-24 DIAGNOSIS — Z85828 Personal history of other malignant neoplasm of skin: Secondary | ICD-10-CM | POA: Diagnosis not present

## 2023-11-24 DIAGNOSIS — L578 Other skin changes due to chronic exposure to nonionizing radiation: Secondary | ICD-10-CM | POA: Diagnosis not present

## 2023-11-24 MED ORDER — COVID-19MRNA BIVAL VACC PFIZER 30 MCG/0.3ML IM SUSP: 0.3000 mL | Freq: Once | INTRAMUSCULAR | 0 refills | Status: AC

## 2023-11-24 MED ORDER — AMLODIPINE BESYLATE 2.5 MG PO TABS: 2.5000 mg | ORAL_TABLET | Freq: Every day | ORAL | 1 refills | Status: AC

## 2023-11-24 MED ORDER — NORETHINDRONE-ETH ESTRADIOL 0.5-2.5 MG-MCG PO TABS: 1.0000 | ORAL_TABLET | Freq: Every day | ORAL | 1 refills | Status: AC

## 2023-11-24 MED ORDER — ATOVAQUONE-PROGUANIL HCL 250-100 MG PO TABS: 1.0000 | ORAL_TABLET | Freq: Every day | ORAL | 0 refills | Status: DC

## 2023-11-24 NOTE — Assessment & Plan Note (Addendum)
 She has been very stressed out due to her mother's health issues. (Mother is > 90 has mixed dementia, and lives alone with daytime supervision by patient and patient's siblings,.  She notes that all of her elevated readings are on the day she cares for her mother.  There is some conflict between siblings surrounding caregiver issues, and patient would benefit from counselling . Referral to [facility] in process

## 2023-11-24 NOTE — Assessment & Plan Note (Signed)
 HOME READINGS have ranged from 21/78 to 151/89.  Dietary sodium   caffeine, and caregiver stress all identified as contributors.  Prn amlodipine   prescribed for use during acute elevations to > 150 and to add if daily readings become elveated to 130/80 or higher

## 2023-11-24 NOTE — Progress Notes (Unsigned)
 Subjective:  Patient ID: Evelyn Graham, female    DOB: Jun 03, 1960  Age: 63 y.o. MRN: [id number]  CC: There were no encounter diagnoses.   HPI [name] presents for  Chief Complaint  Patient presents with   Medical Management of Chronic Issues    6 month follow up    TRAVELLING A LOT:  WENT TO [place] AND [place] IN MARCH  RETURNED FROM [place] 3 WEEKS AGO  GOING TO [place] IN OCTOBER    STRESS; CARIG FOR 89 YR OLD  MOTHER WITH MIXED /VASCULAR DEMENTIA WHO CONTINUES TO LIVE ALONE     Outpatient Medications Prior to Visit  Medication Sig Dispense Refill   aspirin 81 MG tablet Take 81 mg by mouth daily.     atorvastatin  (LIPITOR) 20 MG tablet Take 1 tablet (20 mg total) by mouth daily. 90 tablet 3   Cetirizine HCl (ZYRTEC ALLERGY) 10 MG CAPS Take 1 capsule by mouth daily.     metoprolol  succinate (TOPROL -XL) 50 MG 24 hr tablet Take 1 tablet (50 mg total) by mouth daily. TAKE WITH OR IMMEDIATELY FOLLOWING A MEAL. 90 tablet 3   Multiple Vitamin (MULTIVITAMIN WITH MINERALS) TABS tablet      norethindrone -ethinyl estradiol  (FEMHRT LOW DOSE) 0.5-2.5 MG-MCG tablet Take 1 tablet by mouth daily. 84 tablet 1   atovaquone -proguanil (MALARONE ) 250-100 MG TABS tablet Take 1 tablet by mouth daily. for malaria prophylaxis: Start 2 days prior and continue for 7 days after returning from endemic area (Patient not taking: Reported on 11/24/2023) 14 tablet 0   No facility-administered medications prior to visit.    Review of Systems;  Patient denies headache, fevers, malaise, unintentional weight loss, skin rash, eye pain, sinus congestion and sinus pain, sore throat, dysphagia,  hemoptysis , cough, dyspnea, wheezing, chest pain, palpitations, orthopnea, edema, abdominal pain, nausea, melena, diarrhea, constipation, flank pain, dysuria, hematuria, urinary  Frequency, nocturia, numbness, tingling, seizures,  Focal weakness, Loss of consciousness,   Tremor, insomnia, depression, anxiety, and suicidal ideation.      Objective:  BP (!) 170/70   Pulse 71   Ht 5' 4.25 (1.632 m)   Wt 132 lb 12.8 oz (60.2 kg)   SpO2 99%   BMI 22.62 kg/m   BP Readings from Last 3 Encounters:  11/24/23 (!) 170/70  05/20/23 112/70  05/16/22 (!) 160/92    Wt Readings from Last 3 Encounters:  11/24/23 132 lb 12.8 oz (60.2 kg)  05/20/23 138 lb 12.8 oz (63 kg)  05/16/22 141 lb 9.6 oz (64.2 kg)    Physical Exam  Lab Results  Component Value Date   HGBA1C 5.5 05/20/2023   HGBA1C 5.6 05/16/2022   HGBA1C 5.5 04/22/2021    Lab Results  Component Value Date   CREATININE 0.79 11/18/2023   CREATININE 0.83 05/20/2023   CREATININE 0.85 05/16/2022    Lab Results  Component Value Date   WBC 5.8 05/20/2023   HGB 13.7 05/20/2023   HCT 40.4 05/20/2023   PLT 190.0 05/20/2023   GLUCOSE 100 (H) 11/18/2023   CHOL 153 11/18/2023   TRIG 91.0 11/18/2023   HDL 62.70 11/18/2023   LDLDIRECT 77.0 11/18/2023   LDLCALC 73 11/18/2023   ALT 20 11/18/2023   AST 16 11/18/2023   NA 139 11/18/2023   K 3.8 11/18/2023   CL 105 11/18/2023   CREATININE 0.79 11/18/2023   BUN 12 11/18/2023   CO2 27 11/18/2023   TSH 1.70 05/20/2023   HGBA1C 5.5  05/20/2023   MICROALBUR 0.7 05/20/2023    MM 3D SCREENING MAMMOGRAM BILATERAL BREAST Result Date: 05/08/2023 CLINICAL DATA:  Screening. EXAM: DIGITAL SCREENING BILATERAL MAMMOGRAM WITH TOMOSYNTHESIS AND CAD TECHNIQUE: Bilateral screening digital craniocaudal and mediolateral oblique mammograms were obtained. Bilateral screening digital breast tomosynthesis was performed. The images were evaluated with computer-aided detection. COMPARISON:  Previous exam(s). ACR Breast Density Category b: There are scattered areas of fibroglandular density. FINDINGS: There are no findings suspicious for malignancy. IMPRESSION: No mammographic evidence of malignancy. A result letter of this screening mammogram will be mailed directly to the  patient. RECOMMENDATION: Screening mammogram in one year. (Code:SM-B-01Y) BI-RADS CATEGORY  1: Negative. Electronically Signed   By: Dina  Arceo M.D.   On: 05/08/2023 13:38    Assessment & Plan:  .There are no diagnoses linked to this encounter.   I spent 34 minutes on the day of this face to face encounter reviewing patient's  most recent visit with cardiology,  nephrology,  and neurology,  prior relevant surgical and non surgical procedures, recent  labs and imaging studies, counseling on weight management,  reviewing the assessment and plan with patient, and post visit ordering and reviewing of  diagnostics and therapeutics with patient  .   Follow-up: No follow-ups on file.   Verneita LITTIE Kettering, MD

## 2023-11-24 NOTE — Patient Instructions (Signed)
 I AM PRESCRIBING A LOW DOSE OF AMLODIPINE  TO USE IF YOU CHECK YOUR BP AND IT IS 150 OR HIGHER   START USING IT DAILY IF YOUR READINGS ARE CONSISTENTLY > 130/80   CORONARY CT SCAN HAS BEEN ORDERED    REFERRAL TO [facility] FOR COUNSELLING IN PROCESS

## 2023-11-25 NOTE — Assessment & Plan Note (Signed)
 Managed aggressively with high potency statin  due to carotid stenosis with goal LDL  <100.   LDL is at goal and LFTs are normal  continue  use of atorvastatin  20 mg daily   Lab Results  Component Value Date   CHOL 153 11/18/2023   HDL 62.70 11/18/2023   LDLCALC 73 11/18/2023   LDLDIRECT 77.0 11/18/2023   TRIG 91.0 11/18/2023   CHOLHDL 2 11/18/2023   Lab Results  Component Value Date   ALT 20 11/18/2023   AST 16 11/18/2023   ALKPHOS 71 11/18/2023   BILITOT 1.1 11/18/2023

## 2023-11-25 NOTE — Assessment & Plan Note (Signed)
 Malaria prophylaxis prescribed

## 2023-11-26 ENCOUNTER — Telehealth: Payer: Self-pay

## 2023-11-26 DIAGNOSIS — Z136 Encounter for screening for cardiovascular disorders: Secondary | ICD-10-CM

## 2023-11-26 NOTE — Telephone Encounter (Signed)
 Copied from CRM [id number]. Topic: Clinical - Request for Lab/Test Order >> Nov 26, 2023  9:09 AM Burnard DEL wrote: Reason for CRM: Patient called in with question regarding the CT Coronary Morph test order that was placed for her.She said that she requested to have the 99 dollar test that [facility] is offering for the CT cardiac scoring test self pay test that is 99 dollars.

## 2023-11-26 NOTE — Telephone Encounter (Signed)
 I have pended the self pay order for your approval.

## 2023-11-26 NOTE — Addendum Note (Signed)
 Addended by: HARRIETTE RAISIN on: 11/26/2023 10:45 AM   Modules accepted: Orders

## 2023-12-15 ENCOUNTER — Encounter: Payer: Self-pay | Admitting: Internal Medicine

## 2023-12-15 ENCOUNTER — Other Ambulatory Visit (HOSPITAL_COMMUNITY): Payer: Self-pay | Admitting: Internal Medicine

## 2023-12-15 ENCOUNTER — Telehealth: Payer: Self-pay

## 2023-12-15 DIAGNOSIS — E7800 Pure hypercholesterolemia, unspecified: Secondary | ICD-10-CM

## 2023-12-15 NOTE — Telephone Encounter (Signed)
 LMTCB. Need to let pt know that the CT scan on 12/17/2023 has been canceled because it was not the right CT scan. The right one has been ordered and they will reach out to pt to schedule that one.

## 2023-12-17 ENCOUNTER — Ambulatory Visit

## 2023-12-18 ENCOUNTER — Ambulatory Visit
Admission: RE | Admit: 2023-12-18 | Discharge: 2023-12-18 | Disposition: A | Discharge status: Home / Self Care | Payer: Self-pay | Source: Ambulatory Visit | Attending: Internal Medicine | Admitting: Internal Medicine

## 2023-12-18 DIAGNOSIS — E7800 Pure hypercholesterolemia, unspecified: Secondary | ICD-10-CM | POA: Insufficient documentation

## 2023-12-20 ENCOUNTER — Ambulatory Visit: Payer: Self-pay | Admitting: Internal Medicine

## 2024-01-15 ENCOUNTER — Ambulatory Visit
Admission: EM | Admit: 2024-01-15 | Discharge: 2024-01-15 | Disposition: A | Discharge status: Home / Self Care | Attending: Emergency Medicine | Admitting: Emergency Medicine

## 2024-01-15 DIAGNOSIS — J0100 Acute maxillary sinusitis, unspecified: Secondary | ICD-10-CM | POA: Diagnosis not present

## 2024-01-15 MED ORDER — AMOXICILLIN-POT CLAVULANATE 875-125 MG PO TABS: 1.0000 | ORAL_TABLET | Freq: Two times a day (BID) | ORAL | 0 refills | Status: DC

## 2024-01-15 NOTE — ED Provider Notes (Signed)
 Evelyn Graham    CSN: [id number] Arrival date & time: 01/15/24  1125      History   Chief Complaint Chief Complaint  Patient presents with   Nasal Congestion   Sore Throat   Ear Fullness    HPI Evelyn Graham is a 63 y.o. female.  Patient presents with 10-day history of nasal congestion, runny nose, postnasal drip, ear pain, sore throat, cough.  She reports subjective fever but has not taken her temperature.  No shortness of breath, vomiting, diarrhea.  She has been treating her symptoms with OTC cold medication.  The history is provided by the patient and medical records.    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    Hyperlipidemia    Menopause syndrome 08/30/2015   Other fatigue 08/30/2015   Vitamin D  deficiency 08/30/2015    Patient Active Problem List   Diagnosis Date Noted   Caregiver stress 11/24/2023   Need for MMR vaccine 05/21/2023   Need for polio vaccination 05/21/2023   Travel advice encounter 05/20/2023   Cognitive complaints with normal neuropsychological exam 05/20/2023   Encounter for preventive health examination 08/28/2018   Encounter for screening colonoscopy 09/25/2015   Vitamin D  deficiency 08/30/2015   Hyperlipidemia 08/30/2015   Other fatigue 08/30/2015   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   White coat syndrome with hypertension 08/30/2015   Carotid stenosis 08/30/2015   Menopause syndrome 08/30/2015    Past Surgical History:  Procedure Laterality Date   COLONOSCOPY WITH PROPOFOL  N/A 11/21/2015   Procedure: COLONOSCOPY WITH PROPOFOL ;  Surgeon: Reyes LELON Cota, MD;  Location: [facility] ENDOSCOPY;   Service: Endoscopy;  Laterality: N/A;   GANGLION CYST EXCISION Left 2007   SKIN CANCER EXCISION  11/2022    OB History     Gravida  1   Para  0   Term      Preterm      AB  1   Living         SAB  1   IAB      Ectopic      Multiple      Live Births           Obstetric Comments  1st Menstrual Cycle:  16  1st Pregnancy:  25           Home Medications    Prior to Admission medications   Medication Sig Start Date End Date Taking? Authorizing Provider  amoxicillin-clavulanate (AUGMENTIN) 875-125 MG tablet Take 1 tablet by mouth every 12 (twelve) hours. 01/15/24  Yes Corlis Burnard DEL, NP  amLODipine  (NORVASC ) 2.5 MG tablet Take 1 tablet (2.5 mg total) by mouth daily. 11/24/23   Tullo, Teresa L, MD  aspirin 81 MG tablet Take 81 mg by mouth daily.    [provider]  atorvastatin  (LIPITOR) 20 MG tablet Take 1 tablet (20 mg total) by mouth daily. 05/20/23   Marylynn Verneita CROME, MD  atovaquone -proguanil (MALARONE ) 250-100 MG TABS tablet Take 1 tablet by mouth daily. for malaria prophylaxis: Start 2 days prior and continue for 7 days after returning from endemic area Patient not taking: Reported on 01/15/2024 11/24/23  Tullo, Teresa L, MD  Cetirizine HCl (ZYRTEC ALLERGY) 10 MG CAPS Take 1 capsule by mouth daily.    [provider]  metoprolol  succinate (TOPROL -XL) 50 MG 24 hr tablet Take 1 tablet (50 mg total) by mouth daily. TAKE WITH OR IMMEDIATELY FOLLOWING A MEAL. 05/20/23   Marylynn Verneita CROME, MD  Multiple Vitamin (MULTIVITAMIN WITH MINERALS) TABS tablet  06/16/22   [provider]  norethindrone -ethinyl estradiol  (FEMHRT LOW DOSE) 0.5-2.5 MG-MCG tablet Take 1 tablet by mouth daily. 11/24/23   Marylynn Verneita CROME, MD    Family History Family History  Problem Relation Age of Onset   Hyperlipidemia Mother    Paget's disease of bone Mother    Heart disease Father 98       early forties   Hyperlipidemia Father    Hypertension Father    Cancer Maternal Uncle  13       colon   Heart disease Maternal Grandmother    Heart disease Maternal Grandfather    Cancer Paternal Grandmother        stomach cancer?   Cancer Paternal Grandfather        stomach cancer?   Breast cancer Neg Hx     Social History Social History   Tobacco Use   Smoking status: Never   Smokeless tobacco: Never  Vaping Use   Vaping status: Never Used  Substance Use Topics   Alcohol use: Yes    Alcohol/week: 0.0 standard drinks of alcohol    Comment: 3 to 4 glasses of wine per week   Drug use: No     Allergies   Sulfa antibiotics   Review of Systems Review of Systems  Constitutional:  Negative for chills and fever.  HENT:  Positive for congestion, ear pain, postnasal drip, rhinorrhea, sinus pressure and sore throat.   Respiratory:  Positive for cough. Negative for shortness of breath.   Gastrointestinal:  Negative for diarrhea and vomiting.     Physical Exam Triage Vital Signs ED Triage Vitals  Encounter Vitals Group     BP 01/15/24 1158 132/76     Girls Systolic BP Percentile --      Girls Diastolic BP Percentile --      Boys Systolic BP Percentile --      Boys Diastolic BP Percentile --      Pulse Rate 01/15/24 1142 62     Resp 01/15/24 1142 18     Temp 01/15/24 1142 (!) 97.4 F (36.3 C)     Temp src --      SpO2 01/15/24 1142 98 %     Weight --      Height --      Head Circumference --      Peak Flow --      Pain Score 01/15/24 1138 3     Pain Loc --      Pain Education --      Exclude from Growth Chart --    No data found.  Updated Vital Signs BP 132/76   Pulse 62   Temp (!) 97.4 F (36.3 C)   Resp 18   SpO2 98%   Visual Acuity Right Eye Distance:   Left Eye Distance:   Bilateral Distance:    Right Eye Near:   Left Eye Near:    Bilateral Near:     Physical Exam Constitutional:      General: She is not in acute distress. HENT:     Right Ear: Tympanic membrane normal.  Left Ear: Tympanic membrane normal.     Nose:  Congestion and rhinorrhea present.     Mouth/Throat:     Mouth: Mucous membranes are moist.     Pharynx: Oropharynx is clear.  Cardiovascular:     Rate and Rhythm: Normal rate and regular rhythm.     Heart sounds: Normal heart sounds.  Pulmonary:     Effort: Pulmonary effort is normal. No respiratory distress.     Breath sounds: Normal breath sounds.  Neurological:     Mental Status: She is alert.      UC Treatments / Results  Labs (all labs ordered are listed, but only abnormal results are displayed) Labs Reviewed - No data to display  EKG   Radiology No results found.  Procedures Procedures (including critical care time)  Medications Ordered in UC Medications - No data to display  Initial Impression / Assessment and Plan / UC Course  I have reviewed the triage vital signs and the nursing notes.  Pertinent labs & imaging results that were available during my care of the patient were reviewed by me and considered in my medical decision making (see chart for details).    Acute sinusitis.  Afebrile and vital signs are stable.  Lungs are clear and O2 sat is 98% on room air.  Treating today with Augmentin.  Tylenol  or ibuprofen  as needed.  Plain Mucinex as needed.  Instructed patient to follow up with her PCP if her symptoms are not improving.  She agrees to plan of care.   Final Clinical Impressions(s) / UC Diagnoses   Final diagnoses:  Acute non-recurrent maxillary sinusitis     Discharge Instructions      Take Augmentin as directed.  Follow-up with your primary care provider if your symptoms are not improving.      ED Prescriptions     Medication Sig Dispense Auth. Provider   amoxicillin-clavulanate (AUGMENTIN) 875-125 MG tablet Take 1 tablet by mouth every 12 (twelve) hours. 14 tablet Corlis Burnard DEL, NP      PDMP not reviewed this encounter.   Corlis Burnard DEL, NP 01/15/24 [phone]

## 2024-01-15 NOTE — ED Triage Notes (Addendum)
 Patient to Urgent Care with complaints of nasal congestion/ drainage/ sore throat/ bilateral ear fullness. Discolored mucus.   Symptoms x10 days. Returned from [place] on Wednesday.   Taking otc cold and flu.

## 2024-01-15 NOTE — Discharge Instructions (Addendum)
Take Augmentin as directed.    Follow up with your primary care provider if your symptoms are not improving.

## 2024-01-27 ENCOUNTER — Ambulatory Visit: Admitting: Clinical

## 2024-01-28 ENCOUNTER — Ambulatory Visit: Admitting: Clinical

## 2024-01-28 DIAGNOSIS — F411 Generalized anxiety disorder: Secondary | ICD-10-CM | POA: Diagnosis not present

## 2024-01-28 NOTE — Progress Notes (Signed)
   Darice Seats, LCSW
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
## 2024-01-29 ENCOUNTER — Ambulatory Visit
Admission: EM | Admit: 2024-01-29 | Discharge: 2024-01-29 | Disposition: A | Discharge status: Home / Self Care | Attending: Emergency Medicine | Admitting: Emergency Medicine

## 2024-01-29 DIAGNOSIS — J0100 Acute maxillary sinusitis, unspecified: Secondary | ICD-10-CM | POA: Diagnosis not present

## 2024-01-29 MED ORDER — PREDNISONE 10 MG (21) PO TBPK: ORAL_TABLET | Freq: Every day | ORAL | 0 refills | Status: DC

## 2024-01-29 MED ORDER — CEFDINIR 300 MG PO CAPS: 300.0000 mg | ORAL_CAPSULE | Freq: Two times a day (BID) | ORAL | 0 refills | Status: AC

## 2024-01-29 NOTE — ED Triage Notes (Signed)
 Patient to Urgent Care with complaints of facial pain/ nasal congestion/ drainage.   Symptoms x2 weeks. Previously treated with augmentin. Had some improvement w/ symptoms but now feels bad.

## 2024-01-29 NOTE — ED Provider Notes (Signed)
 [name]    CSN: [id number] Arrival date & time: 01/29/24  1122      History   Chief Complaint Chief Complaint  Patient presents with   Nasal Congestion    HPI Evelyn Graham is a 63 y.o. female.  Patient presents with ongoing postnasal drip, sinus pressure, congestion, runny nose, cough for >2 weeks.  No fever or shortness of breath.  She was improving somewhat while taking Augmentin but her symptoms became worse again on the last 2 days of the 7-day course and have been progressively worse since then.  No OTC medications taken today.  Patient was seen here on 01/15/2024; diagnosed with sinusitis; treated with 7-day course of Augmentin.  The history is provided by the patient and medical records.    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    Hyperlipidemia    Menopause syndrome 08/30/2015   Other fatigue 08/30/2015   Vitamin D  deficiency 08/30/2015    Patient Active Problem List   Diagnosis Date Noted   Caregiver stress 11/24/2023   Need for MMR vaccine 05/21/2023   Need for polio vaccination 05/21/2023   Travel advice encounter 05/20/2023   Cognitive complaints with normal neuropsychological exam 05/20/2023   Encounter for preventive health examination 08/28/2018   Encounter for screening colonoscopy 09/25/2015   Vitamin D  deficiency 08/30/2015   Hyperlipidemia 08/30/2015   Other fatigue 08/30/2015   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   White coat syndrome with hypertension 08/30/2015   Carotid stenosis 08/30/2015   Menopause syndrome 08/30/2015    Past Surgical History:  Procedure Laterality Date   COLONOSCOPY WITH  PROPOFOL  N/A 11/21/2015   Procedure: COLONOSCOPY WITH PROPOFOL ;  Surgeon: Reyes LELON Cota, MD;  Location: [facility] ENDOSCOPY;  Service: Endoscopy;  Laterality: N/A;   GANGLION CYST EXCISION Left 2007   SKIN CANCER EXCISION  11/2022    OB History     Gravida  1   Para  0   Term      Preterm      AB  1   Living         SAB  1   IAB      Ectopic      Multiple      Live Births           Obstetric Comments  1st Menstrual Cycle:  16  1st Pregnancy:  25           Home Medications    Prior to Admission medications   Medication Sig Start Date End Date Taking? Authorizing Provider  cefdinir (OMNICEF) 300 MG capsule Take 1 capsule (300 mg total) by mouth 2 (two) times daily for 10 days. 01/29/24 02/08/24 Yes Corlis Burnard DEL, NP  predniSONE (STERAPRED UNI-PAK 21 TAB) 10 MG (21) TBPK tablet Take by mouth daily. As directed 01/29/24  Yes Corlis Burnard DEL, NP  amLODipine  (NORVASC ) 2.5 MG tablet Take 1 tablet (2.5 mg total) by mouth daily. 11/24/23   Tullo, Teresa L, MD  aspirin 81 MG tablet Take 81 mg by mouth daily.    [provider]  atorvastatin  (LIPITOR) 20 MG tablet Take 1 tablet (20 mg  total) by mouth daily. 05/20/23   Marylynn Verneita CROME, MD  atovaquone -proguanil (MALARONE ) 250-100 MG TABS tablet Take 1 tablet by mouth daily. for malaria prophylaxis: Start 2 days prior and continue for 7 days after returning from endemic area Patient not taking: Reported on 01/15/2024 11/24/23   Marylynn Verneita CROME, MD  Cetirizine HCl (ZYRTEC ALLERGY) 10 MG CAPS Take 1 capsule by mouth daily.    [provider]  metoprolol  succinate (TOPROL -XL) 50 MG 24 hr tablet Take 1 tablet (50 mg total) by mouth daily. TAKE WITH OR IMMEDIATELY FOLLOWING A MEAL. 05/20/23   Marylynn Verneita CROME, MD  Multiple Vitamin (MULTIVITAMIN WITH MINERALS) TABS tablet  06/16/22   [provider]  norethindrone -ethinyl estradiol  (FEMHRT LOW DOSE) 0.5-2.5 MG-MCG tablet Take 1 tablet by mouth daily. 11/24/23   Marylynn Verneita CROME, MD    Family History Family History  Problem Relation Age of Onset   Hyperlipidemia Mother    Paget's disease of bone Mother    Heart disease Father 65       early forties   Hyperlipidemia Father    Hypertension Father    Cancer Maternal Uncle 36       colon   Heart disease Maternal Grandmother    Heart disease Maternal Grandfather    Cancer Paternal Grandmother        stomach cancer?   Cancer Paternal Grandfather        stomach cancer?   Breast cancer Neg Hx     Social History Social History   Tobacco Use   Smoking status: Never   Smokeless tobacco: Never  Vaping Use   Vaping status: Never Used  Substance Use Topics   Alcohol use: Yes    Alcohol/week: 0.0 standard drinks of alcohol    Comment: 3 to 4 glasses of wine per week   Drug use: No     Allergies   Sulfa antibiotics   Review of Systems Review of Systems  Constitutional:  Negative for chills and fever.  HENT:  Positive for congestion, postnasal drip, rhinorrhea and sinus pressure. Negative for ear pain and sore throat.   Respiratory:  Positive for cough. Negative for shortness of breath.      Physical Exam Triage Vital Signs ED Triage Vitals  Encounter Vitals Group     BP 01/29/24 1149 136/85     Girls Systolic BP Percentile --      Girls Diastolic BP Percentile --      Boys Systolic BP Percentile --      Boys Diastolic BP Percentile --      Pulse Rate 01/29/24 1149 61     Resp 01/29/24 1149 18     Temp 01/29/24 1149 98.1 F (36.7 C)     Temp src --      SpO2 01/29/24 1149 99 %     Weight --      Height --      Head Circumference --      Peak Flow --      Pain Score 01/29/24 1147 3     Pain Loc --      Pain Education --      Exclude from Growth Chart --    No data found.  Updated Vital Signs BP 136/85   Pulse 61   Temp 98.1 F (36.7 C)   Resp 18   SpO2 99%   Visual Acuity Right Eye Distance:   Left Eye Distance:   Bilateral Distance:  Right Eye Near:   Left  Eye Near:    Bilateral Near:     Physical Exam Constitutional:      General: She is not in acute distress. HENT:     Right Ear: Tympanic membrane normal.     Left Ear: Tympanic membrane normal.     Nose: Congestion and rhinorrhea present.     Mouth/Throat:     Mouth: Mucous membranes are moist.     Pharynx: Oropharynx is clear.  Cardiovascular:     Rate and Rhythm: Normal rate and regular rhythm.     Heart sounds: Normal heart sounds.  Pulmonary:     Effort: Pulmonary effort is normal. No respiratory distress.     Breath sounds: Normal breath sounds.  Neurological:     Mental Status: She is alert.      UC Treatments / Results  Labs (all labs ordered are listed, but only abnormal results are displayed) Labs Reviewed - No data to display  EKG   Radiology No results found.  Procedures Procedures (including critical care time)  Medications Ordered in UC Medications - No data to display  Initial Impression / Assessment and Plan / UC Course  I have reviewed the triage vital signs and the nursing notes.  Pertinent labs & imaging results that were available during my care of the patient were reviewed by me and considered in my medical decision making (see chart for details).    Acute ongoing sinusitis.  Afebrile and vital signs are stable.  Lungs are clear and O2 sat is 99% on room air.  Patient's symptoms did not resolve with recent 7-day course of Augmentin.  Treating today with 10-day course of cefdinir and 6-day taper of prednisone.  Instructed patient to follow-up with her PCP on Monday.  Education provided on sinusitis.  ED precautions given.  Patient agrees to plan of care.  Final Clinical Impressions(s) / UC Diagnoses   Final diagnoses:  Acute non-recurrent maxillary sinusitis     Discharge Instructions      Take the cefdinir and prednisone as directed.  Follow up with your primary care provider on Monday.        ED Prescriptions     Medication Sig  Dispense Auth. Provider   cefdinir (OMNICEF) 300 MG capsule Take 1 capsule (300 mg total) by mouth 2 (two) times daily for 10 days. 20 capsule Corlis Burnard DEL, NP   predniSONE (STERAPRED UNI-PAK 21 TAB) 10 MG (21) TBPK tablet Take by mouth daily. As directed 21 tablet Corlis Burnard DEL, NP      PDMP not reviewed this encounter.   Corlis Burnard DEL, NP 01/29/24 [phone]

## 2024-01-29 NOTE — Discharge Instructions (Signed)
 Take the cefdinir and prednisone as directed.  Follow up with your primary care provider on Monday.

## 2024-02-04 ENCOUNTER — Ambulatory Visit
Admission: RE | Admit: 2024-02-04 | Discharge: 2024-02-04 | Disposition: A | Discharge status: Home / Self Care | Attending: Internal Medicine | Admitting: Internal Medicine

## 2024-02-04 ENCOUNTER — Ambulatory Visit
Admission: RE | Admit: 2024-02-04 | Discharge: 2024-02-04 | Disposition: A | Discharge status: Home / Self Care | Source: Ambulatory Visit | Attending: Internal Medicine | Admitting: Internal Medicine

## 2024-02-04 ENCOUNTER — Encounter: Payer: Self-pay | Admitting: Internal Medicine

## 2024-02-04 ENCOUNTER — Ambulatory Visit: Admitting: Internal Medicine

## 2024-02-04 VITALS — BP 150/72 | HR 64 | Ht 64.25 in | Wt 128.2 lb

## 2024-02-04 DIAGNOSIS — J019 Acute sinusitis, unspecified: Secondary | ICD-10-CM | POA: Diagnosis not present

## 2024-02-04 DIAGNOSIS — R918 Other nonspecific abnormal finding of lung field: Secondary | ICD-10-CM | POA: Diagnosis not present

## 2024-02-04 DIAGNOSIS — B9689 Other specified bacterial agents as the cause of diseases classified elsewhere: Secondary | ICD-10-CM

## 2024-02-04 DIAGNOSIS — R053 Chronic cough: Secondary | ICD-10-CM | POA: Diagnosis not present

## 2024-02-04 MED ORDER — AZELASTINE-FLUTICASONE 137-50 MCG/ACT NA SUSP: 1.0000 | Freq: Two times a day (BID) | NASAL | 1 refills | Status: AC

## 2024-02-04 MED ORDER — PREDNISONE 10 MG PO TABS: ORAL_TABLET | ORAL | 0 refills | Status: DC

## 2024-02-04 NOTE — Patient Instructions (Signed)
 Daily use of Probiotics for  3 weeks is STRONGLY advised to reduce risk of C dificile colitis.    Chest x ray today   Combination nasal spray for the next 2 weeks  Ok to add prednisone  or Advil  if sinuses  are still  congested  or feeling pressured  despite  nasal spray

## 2024-02-04 NOTE — Progress Notes (Signed)
 Subjective:  Patient ID: Evelyn Graham, female    DOB: [birth year]  Age: 63 y.o. MRN: [id number]  CC: The primary encounter diagnosis was Lung field abnormal finding on examination. A diagnosis of Acute bacterial sinusitis was also pertinent to this visit.   HPI Evelyn Graham presents for  Chief Complaint  Patient presents with   Hospitalization Follow-up    Follow up from urgent care    [facility] for sinusitis involving maxillary and frontal sinsues  that persisted despite treatment for 7 days starting Oct 31 with augmentin .  At follow up on nov 14 she was prescribed cefdinir   and a prednisone  taper.  She feels better today; all symptoms are slowly improving.   She was not given prednisone  the first round,  but felt amn improvement  24 hours after she started the prednisone  taper. She denies shortness of breath and pleurisy   Symptoms started after flying into [place], [place]   for  a [employer] down the [employer]  Per patient the air quality was horrible In [place] due to multiple pollutants from car exhaust, the public burning of incense and trash  n the streets, by the residents of [place], nurse, mental health and medical laboratory scientific officer feces littering the streets., cigarette smoke and an ambient temperature of 100 degree Farenheit.   She was nauseated constantly from the stench .    Outpatient Medications Prior to Visit  Medication Sig Dispense Refill   amLODipine  (NORVASC ) 2.5 MG tablet Take 1 tablet (2.5 mg total) by mouth daily. 90 tablet 1   aspirin 81 MG tablet Take 81 mg by mouth daily.     atorvastatin  (LIPITOR) 20 MG tablet Take 1 tablet (20 mg total) by mouth daily. 90 tablet 3   cefdinir  (OMNICEF ) 300 MG capsule Take 1 capsule (300 mg total) by mouth 2 (two) times daily for 10 days. 20 capsule 0   Cetirizine HCl (ZYRTEC ALLERGY) 10 MG CAPS Take 1 capsule by mouth daily.     metoprolol  succinate (TOPROL -XL) 50 MG 24 hr tablet Take 1 tablet (50 mg total) by mouth daily. TAKE  WITH OR IMMEDIATELY FOLLOWING A MEAL. 90 tablet 3   Multiple Vitamin (MULTIVITAMIN WITH MINERALS) TABS tablet      norethindrone -ethinyl estradiol  (FEMHRT LOW DOSE) 0.5-2.5 MG-MCG tablet Take 1 tablet by mouth daily. 84 tablet 1   predniSONE  (STERAPRED UNI-PAK 21 TAB) 10 MG (21) TBPK tablet Take by mouth daily. As directed 21 tablet 0   atovaquone -proguanil (MALARONE ) 250-100 MG TABS tablet Take 1 tablet by mouth daily. for malaria prophylaxis: Start 2 days prior and continue for 7 days after returning from endemic area (Patient not taking: Reported on 02/04/2024) 28 tablet 0   No facility-administered medications prior to visit.    Review of Systems;  Patient denies headache, fevers, malaise, unintentional weight loss, skin rash, eye pain, sinus congestion and sinus pain, sore throat, dysphagia,  hemoptysis , cough, dyspnea, wheezing, chest pain, palpitations, orthopnea, edema, abdominal pain, nausea, melena, diarrhea, constipation, flank pain, dysuria, hematuria, urinary  Frequency, nocturia, numbness, tingling, seizures,  Focal weakness, Loss of consciousness,  Tremor, insomnia, depression, anxiety, and suicidal ideation.      Objective:  BP (!) 150/72   Pulse 64   Ht 5' 4.25 (1.632 m)   Wt 128 lb 3.2 oz (58.2 kg)   SpO2 99%   BMI 21.83 kg/m   BP Readings from Last 3 Encounters:  02/04/24 (!) 150/72  01/29/24 136/85  01/15/24 132/76    Wt Readings from Last 3 Encounters:  02/04/24 128 lb 3.2 oz (58.2 kg)  11/24/23 132 lb 12.8 oz (60.2 kg)  05/20/23 138 lb 12.8 oz (63 kg)    Physical Exam Vitals reviewed.  Constitutional:      General: She is not in acute distress.    Appearance: Normal appearance. She is normal weight. She is not ill-appearing, toxic-appearing or diaphoretic.  HENT:     Head: Normocephalic.  Eyes:     General: No scleral icterus.       Right eye: No discharge.        Left eye: No discharge.     Conjunctiva/sclera: Conjunctivae normal.   Cardiovascular:     Rate and Rhythm: Normal rate and regular rhythm.     Heart sounds: Normal heart sounds.  Pulmonary:     Effort: Pulmonary effort is normal. No respiratory distress.     Breath sounds: Decreased air movement present. Examination of the left-upper field reveals decreased breath sounds. Decreased breath sounds present.  Musculoskeletal:        General: Normal range of motion.  Skin:    General: Skin is warm and dry.  Neurological:     General: No focal deficit present.     Mental Status: She is alert and oriented to person, place, and time. Mental status is at baseline.  Psychiatric:        Mood and Affect: Mood normal.        Behavior: Behavior normal.        Thought Content: Thought content normal.        Judgment: Judgment normal.     Lab Results  Component Value Date   HGBA1C 5.5 05/20/2023   HGBA1C 5.6 05/16/2022   HGBA1C 5.5 04/22/2021    Lab Results  Component Value Date   CREATININE 0.79 11/18/2023   CREATININE 0.83 05/20/2023   CREATININE 0.85 05/16/2022    Lab Results  Component Value Date   WBC 5.8 05/20/2023   HGB 13.7 05/20/2023   HCT 40.4 05/20/2023   PLT 190.0 05/20/2023   GLUCOSE 100 (H) 11/18/2023   CHOL 153 11/18/2023   TRIG 91.0 11/18/2023   HDL 62.70 11/18/2023   LDLDIRECT 77.0 11/18/2023   LDLCALC 73 11/18/2023   ALT 20 11/18/2023   AST 16 11/18/2023   NA 139 11/18/2023   K 3.8 11/18/2023   CL 105 11/18/2023   CREATININE 0.79 11/18/2023   BUN 12 11/18/2023   CO2 27 11/18/2023   TSH 1.70 05/20/2023   HGBA1C 5.5 05/20/2023   MICROALBUR 0.7 05/20/2023    No results found.  Assessment & Plan:  .Lung field abnormal finding on examination Assessment & Plan:  She ahs decreased breath sounds in the left scapular area (LUL) Given recent travel to [place] resulting in sinusitis,  checking CXR   Orders: -     DG Chest 2 View; Future  Acute bacterial sinusitis Assessment & Plan: Improving symptoms with 2nd round of abs,  largely due to use of steroids to manage inflammation caused by poor air quality in [place].  Encouraged to use a steroid nasal spray for a few weeks.    Other orders -     predniSONE ; 6 tablets on Day 1 , then reduce by 1 tablet daily until gone  Dispense: 21 tablet; Refill: 0 -     Azelastine -Fluticasone ; Place 1 spray into the nose every 12 (twelve) hours.  Dispense: 23 g; Refill: 1  I spent 34 minutes on the day of this face to face encounter reviewing patient's  most recent urgent care visit, vrecent  labs and  recent CT chest, reviewing the assessment and plan with patient, and post visit ordering and reviewing of  diagnostics and therapeutics with patient  .   Follow-up: Return in about 2 weeks (around 02/18/2024) for sinusitis.   Verneita LITTIE Kettering, MD

## 2024-02-06 DIAGNOSIS — B9689 Other specified bacterial agents as the cause of diseases classified elsewhere: Secondary | ICD-10-CM | POA: Insufficient documentation

## 2024-02-06 DIAGNOSIS — R918 Other nonspecific abnormal finding of lung field: Secondary | ICD-10-CM | POA: Insufficient documentation

## 2024-02-06 NOTE — Assessment & Plan Note (Signed)
 Improving symptoms with 2nd round of abs, largely due to use of steroids to manage inflammation caused by poor air quality in [place].  Encouraged to use a steroid nasal spray for a few weeks.

## 2024-02-06 NOTE — Assessment & Plan Note (Signed)
 She ahs decreased breath sounds in the left scapular area (LUL) Given recent travel to [place] resulting in sinusitis,  checking CXR

## 2024-02-07 ENCOUNTER — Other Ambulatory Visit: Payer: Self-pay | Admitting: Internal Medicine

## 2024-02-10 ENCOUNTER — Ambulatory Visit: Payer: Self-pay | Admitting: Internal Medicine

## 2024-02-23 ENCOUNTER — Encounter: Payer: Self-pay | Admitting: Internal Medicine

## 2024-02-23 ENCOUNTER — Ambulatory Visit: Admitting: Internal Medicine

## 2024-02-23 DIAGNOSIS — J019 Acute sinusitis, unspecified: Secondary | ICD-10-CM | POA: Diagnosis not present

## 2024-02-23 DIAGNOSIS — Z636 Dependent relative needing care at home: Secondary | ICD-10-CM | POA: Diagnosis not present

## 2024-02-23 DIAGNOSIS — I10 Essential (primary) hypertension: Secondary | ICD-10-CM | POA: Diagnosis not present

## 2024-02-23 DIAGNOSIS — R918 Other nonspecific abnormal finding of lung field: Secondary | ICD-10-CM | POA: Diagnosis not present

## 2024-02-23 NOTE — Progress Notes (Unsigned)
 Subjective:  Patient ID: Evelyn Graham, female    DOB: [birth year]  Age: 63 y.o. MRN: [id number]  CC: There were no encounter diagnoses.   HPI [name] presents for  Chief Complaint  Patient presents with   Medical Management of Chronic Issues   1) elevated BP readings occurring with mom's care . Has been taking    Outpatient Medications Prior to Visit  Medication Sig Dispense Refill   amLODipine  (NORVASC ) 2.5 MG tablet Take 1 tablet (2.5 mg total) by mouth daily. 90 tablet 1   aspirin 81 MG tablet Take 81 mg by mouth daily.     atorvastatin  (LIPITOR) 20 MG tablet Take 1 tablet (20 mg total) by mouth daily. 90 tablet 3   Azelastine -Fluticasone  137-50 MCG/ACT SUSP Place 1 spray into the nose every 12 (twelve) hours. 23 g 1   Cetirizine HCl (ZYRTEC ALLERGY) 10 MG CAPS Take 1 capsule by mouth daily.     metoprolol  succinate (TOPROL -XL) 50 MG 24 hr tablet Take 1 tablet (50 mg total) by mouth daily. TAKE WITH OR IMMEDIATELY FOLLOWING A MEAL. 90 tablet 3   Multiple Vitamin (MULTIVITAMIN WITH MINERALS) TABS tablet      norethindrone -ethinyl estradiol  (FEMHRT LOW DOSE) 0.5-2.5 MG-MCG tablet Take 1 tablet by mouth daily. 84 tablet 1   predniSONE  (DELTASONE ) 10 MG tablet 6 tablets on Day 1 , then reduce by 1 tablet daily until gone 21 tablet 0   predniSONE  (STERAPRED UNI-PAK 21 TAB) 10 MG (21) TBPK tablet Take by mouth daily. As directed 21 tablet 0   No facility-administered medications prior to visit.    Review of Systems;  Patient denies headache, fevers, malaise, unintentional weight loss, skin rash, eye pain, sinus congestion and sinus pain, sore throat, dysphagia,  hemoptysis , cough, dyspnea, wheezing, chest pain, palpitations, orthopnea, edema, abdominal pain, nausea, melena, diarrhea, constipation, flank pain, dysuria, hematuria, urinary  Frequency, nocturia, numbness, tingling, seizures,  Focal weakness, Loss of consciousness,  Tremor, insomnia, depression,  anxiety, and suicidal ideation.      Objective:  BP (!) 140/72   Pulse 65   Ht 5' 4.25 (1.632 m)   Wt 130 lb 9.6 oz (59.2 kg)   SpO2 99%   BMI 22.24 kg/m   BP Readings from Last 3 Encounters:  02/23/24 (!) 140/72  02/04/24 (!) 150/72  01/29/24 136/85    Wt Readings from Last 3 Encounters:  02/23/24 130 lb 9.6 oz (59.2 kg)  02/04/24 128 lb 3.2 oz (58.2 kg)  11/24/23 132 lb 12.8 oz (60.2 kg)    Physical Exam  Lab Results  Component Value Date   HGBA1C 5.5 05/20/2023   HGBA1C 5.6 05/16/2022   HGBA1C 5.5 04/22/2021    Lab Results  Component Value Date   CREATININE 0.79 11/18/2023   CREATININE 0.83 05/20/2023   CREATININE 0.85 05/16/2022    Lab Results  Component Value Date   WBC 5.8 05/20/2023   HGB 13.7 05/20/2023   HCT 40.4 05/20/2023   PLT 190.0 05/20/2023   GLUCOSE 100 (H) 11/18/2023   CHOL 153 11/18/2023   TRIG 91.0 11/18/2023   HDL 62.70 11/18/2023   LDLDIRECT 77.0 11/18/2023   LDLCALC 73 11/18/2023   ALT 20 11/18/2023   AST 16 11/18/2023   NA 139 11/18/2023   K 3.8 11/18/2023   CL 105 11/18/2023   CREATININE 0.79 11/18/2023   BUN 12 11/18/2023   CO2 27 11/18/2023   TSH 1.70 05/20/2023   HGBA1C 5.5 05/20/2023  MICROALBUR 0.7 05/20/2023    DG Chest 2 View Result Date: 02/09/2024 EXAM: 2 VIEW(S) XRAY OF THE CHEST 02/04/2024 12:23:12 PM COMPARISON: None available. CLINICAL HISTORY: persistent cough , decreaed BS on the left FINDINGS: LUNGS AND PLEURA: No focal pulmonary opacity. No pleural effusion. No pneumothorax. HEART AND MEDIASTINUM: No acute abnormality of the cardiac and mediastinal silhouettes. BONES AND SOFT TISSUES: No acute osseous abnormality. IMPRESSION: 1. No acute cardiopulmonary process Electronically signed by: Oneil Devonshire MD 02/09/2024 10:48 PM EST RP Workstation: [id number]    Assessment & Plan:  .There are no diagnoses linked to this encounter.   I spent 34 minutes on the day of this face to face encounter reviewing  patient's  most recent visit with cardiology,  nephrology,  and neurology,  prior relevant surgical and non surgical procedures, recent  labs and imaging studies, counseling on weight management,  reviewing the assessment and plan with patient, and post visit ordering and reviewing of  diagnostics and therapeutics with patient  .   Follow-up: No follow-ups on file.   Verneita LITTIE Kettering, MD

## 2024-02-23 NOTE — Patient Instructions (Signed)
 Your blood pressures are fine on 2.5 mg amlodipine  (when you are not stressed)  Only increase the dose to 5 mg IF YOUR readings at rest are all > 130/80   Send me a message before your [place] trip so I can send in some travel meds for you   Best wishes for a joyous Christmas season and a happy New Year!  Verneita Kettering, MD

## 2024-02-24 NOTE — Assessment & Plan Note (Signed)
 Resolved following extended treatment with empiric abx and steroids.

## 2024-02-24 NOTE — Assessment & Plan Note (Signed)
 Chest x ray was normal and repeat exam today is normal

## 2024-02-24 NOTE — Assessment & Plan Note (Signed)
 She remains quite frustrated as the sole caregiver  for her 13 = yr  old  mother with dementia, despite having 2 siblings who live nearby,   mother  has mixed dementia, and lives alone with daytime supervision by patient a  She notes that all of her elevated readings are on the day she cares for her mother.  There is some conflict between siblings surrounding caregiver issues, and patient was referred for counselling at last visit  but has deferred

## 2024-02-24 NOTE — Assessment & Plan Note (Addendum)
 HOME READINGS have ranged from 117 systolic to 151 continue daily amlodipine  2.5 mg dose  and advised to increase dose to 5 mg  for  at rest readings > 130 systolic

## 2024-03-16 ENCOUNTER — Ambulatory Visit: Admitting: Clinical

## 2024-03-16 DIAGNOSIS — F411 Generalized anxiety disorder: Secondary | ICD-10-CM

## 2024-03-16 NOTE — Progress Notes (Signed)
 Behavioral Health Treatment Plan   Name:Evelyn Graham  [employer]  MRN: [id number]   Treatment Plan Development Date: 03/16/24   Strengths: Supportive Relationships and exercising, walking, being outside (during warmer months)  Supports: Spouse and Friends   Theatre Manager of Needs: At time of initial assessment patient stated, I've just been really anxious lately, I think I've had anxiety since I was a child, its gotten worse.    Treatment Level: outpatient therapy  Client Treatment Preferences: in person appointments   Diagnosis: Anxiety  Generalized Anxiety Disorder  Symptoms:  Excessive and/or unrealistic worry that is difficult to control occurring more days than not for at least 6 months about a number of events or activities, Difficulty managing worry, Restlessness or feeling keyed up or on edge, Difficulty concentrating or mind going blank, Irritability, and Sleep disturbance (Difficulty falling or staying asleep, restlessness, or unsatisfying sleep  Goals:  Improve daily functioning by reducing overall anxiety symptoms including intensity, frequency, and duration of symptoms., Build and employ tools and skills to reduce symptoms of anxiety, worry, and improve functioning day-to-day., and Address negative cognitions, distortions, and behaviors that contribute to anxiety symptoms and affect overall functioning.  Objectives: Target Date For All Objectives: 03/16/25  Develop coping tools to manage anxiety including mindfulness, acceptance, relaxation, reframing, and challenging negative thoughts and feelings., Identify, challenge, and manage negative self-talk, negative thinking about self and others, and maintain mindfulness regarding self-talk and cognitive distortions., Develop consistent self-care routine including exercise, healthful eating, consistent sleep., and Identify contributing factors to anxiety including previous or current  situations.  Progress Documentation:  Established 03/16/24  Interventions:  Psychoeducation regarding diagnoses and treatment, CBT - reframing, challenging, cognitive restructuring, Relaxation and mindfulness, Self-Care - exercise, sleep, nutrition, and CBTI   Expected duration of treatment:  12 months  Party responsible for implementation of interventions: Darice Seats, MSW, LCSW, therapist, and Tyson Litter, patient.   This plan has been reviewed and created by the following participants: Darice Seats, MSW, LCSW, therapist, and Tyson Litter, patient.   A new plan will be created at least every 12 months.  The patient fully participated in the development of treatment plan with the clinician and verbally consents to such treatment.   Patient Treatment Plan Signature Obtained: Yes, please see shared S: [address] for sign off.                   Darice Seats, LCSW

## 2024-03-16 NOTE — Progress Notes (Signed)
 "  [facility] Counselor/Therapist Progress Note  Patient ID: Evelyn Graham, MRN: [id number],    Date: 03/16/2024  Time Spent: 9:41am - 10:23am : 42 minutes   Treatment Type: Individual Therapy  Reported Symptoms: anxiety, worry  Mental Status Exam: Appearance:  Neat and Well Groomed     Behavior: Appropriate  Motor: Normal  Speech/Language:  Clear and Coherent and Normal Rate  Affect: Appropriate  Mood: normal  Thought process: normal  Thought content:   WNL  Sensory/Perceptual disturbances:   WNL  Orientation: oriented to person, place, time/date, and situation  Attention: Good  Concentration: Good  Memory: WNL  Fund of knowledge:  Good  Insight:   Good  Judgment:  Good  Impulse Control: Good   Risk Assessment: Danger to Self:  No Patient denied current suicidal ideation  Self-injurious Behavior: No Danger to Others: No Patient denied current homicidal ideation Duty to Warn:no Physical Aggression / Violence:No  Access to Firearms a concern: No  Gang Involvement:No   Subjective:  Patient reported no changes since last session. Patient stated, pretty much the same in response to mood since last session. Patient reported anxiety comes and goes. Patient stated, not bad today in response to current mood. Patient stated, I believe that to be true in response to diagnosis of Generalized Anxiety Disorder. Patient reported waking up every two hours during the night. Patient stated, I don't have any objection to that in response to CBTI. Patient stated, again no objection to that in response to participation in therapy. Patient stated, just to feel better, not to be so worried all the time in response to goals. Patient stated, not to feel so anxious, learning to manage what I can't control in response to goals.   Interventions: Motivational Interviewing. Clinician conducted session in person at clinician's office at [facility].  Clinician reviewed diagnosis and treatment recommendations; and provided psycho education related to diagnosis and treatment as part of treatment planning process. Discussed recommendation for CBTI. Clinician utilized motivational interviewing to explore potential goals for therapy. Clinician utilized a task centered approach in collaboration with patient to develop treatment plan. Patient participated in development of treatment plan and verbally agreed to treatment plan.  Collaboration of Care: Other not required at this time  Diagnosis:Generalized anxiety disorder  Plan: Behavioral Health Treatment Plan   Name:Evelyn Graham  [employer]  MRN: [id number]   Treatment Plan Development Date: 03/16/24   Strengths: Supportive Relationships and exercising, walking, being outside (during warmer months)  Supports: Spouse and Friends   Theatre Manager of Needs: At time of initial assessment patient stated, I've just been really anxious lately, I think I've had anxiety since I was a child, its gotten worse.    Treatment Level: outpatient therapy  Client Treatment Preferences: in person appointments   Diagnosis: Anxiety  Generalized Anxiety Disorder  Symptoms:  Excessive and/or unrealistic worry that is difficult to control occurring more days than not for at least 6 months about a number of events or activities, Difficulty managing worry, Restlessness or feeling keyed up or on edge, Difficulty concentrating or mind going blank, Irritability, and Sleep disturbance (Difficulty falling or staying asleep, restlessness, or unsatisfying sleep  Goals:  Improve daily functioning by reducing overall anxiety symptoms including intensity, frequency, and duration of symptoms., Build and employ tools and skills to reduce symptoms of anxiety, worry, and improve functioning day-to-day., and Address negative cognitions, distortions, and behaviors that contribute to anxiety  symptoms and affect  overall functioning.  Objectives: Target Date For All Objectives: 03/16/25  Develop coping tools to manage anxiety including mindfulness, acceptance, relaxation, reframing, and challenging negative thoughts and feelings., Identify, challenge, and manage negative self-talk, negative thinking about self and others, and maintain mindfulness regarding self-talk and cognitive distortions., Develop consistent self-care routine including exercise, healthful eating, consistent sleep., and Identify contributing factors to anxiety including previous or current situations.  Progress Documentation:  Established 03/16/24  Interventions:  Psychoeducation regarding diagnoses and treatment, CBT - reframing, challenging, cognitive restructuring, Relaxation and mindfulness, Self-Care - exercise, sleep, nutrition, and CBTI   Expected duration of treatment:  12 months  Party responsible for implementation of interventions: Darice Seats, MSW, LCSW, therapist, and Tyson Litter, patient.   This plan has been reviewed and created by the following participants: Darice Seats, MSW, LCSW, therapist, and Tyson Litter, patient.   A new plan will be created at least every 12 months.  The patient fully participated in the development of treatment plan with the clinician and verbally consents to such treatment.   Patient Treatment Plan Signature Obtained: Yes, please see shared [address] for sign off.     Darice Seats, LCSW    "

## 2024-03-16 NOTE — Progress Notes (Signed)
   Darice Seats, LCSW

## 2024-04-26 ENCOUNTER — Ambulatory Visit: Admitting: Clinical

## 2024-05-10 ENCOUNTER — Ambulatory Visit: Admitting: Clinical

## 2025-02-22 ENCOUNTER — Encounter: Admitting: Internal Medicine
